# Patient Record
Sex: Female | Born: 1955 | Hispanic: No | Marital: Married | Smoking: Never smoker
Health system: Southern US, Community
[De-identification: ages and names within clinical notes are randomized; demographics above are authoritative.]

## PROBLEM LIST (undated history)

## (undated) DIAGNOSIS — E7800 Pure hypercholesterolemia, unspecified: Secondary | ICD-10-CM

## (undated) DIAGNOSIS — I10 Essential (primary) hypertension: Secondary | ICD-10-CM

## (undated) DIAGNOSIS — E079 Disorder of thyroid, unspecified: Secondary | ICD-10-CM

## (undated) DIAGNOSIS — K219 Gastro-esophageal reflux disease without esophagitis: Secondary | ICD-10-CM

## (undated) HISTORY — PX: CHOLECYSTECTOMY: SHX55

## (undated) HISTORY — DX: Disorder of thyroid, unspecified: E07.9

## (undated) HISTORY — DX: Gastro-esophageal reflux disease without esophagitis: K21.9

## (undated) HISTORY — PX: THYROIDECTOMY: SHX17

---

## 2001-09-08 ENCOUNTER — Encounter: Payer: Self-pay | Admitting: Family Medicine

## 2001-09-08 ENCOUNTER — Ambulatory Visit (HOSPITAL_COMMUNITY): Admission: RE | Admit: 2001-09-08 | Discharge: 2001-09-08 | Payer: Self-pay | Admitting: Family Medicine

## 2003-07-29 ENCOUNTER — Emergency Department (HOSPITAL_COMMUNITY): Admission: EM | Admit: 2003-07-29 | Discharge: 2003-07-29 | Payer: Self-pay | Admitting: Emergency Medicine

## 2004-01-02 ENCOUNTER — Emergency Department (HOSPITAL_COMMUNITY): Admission: EM | Admit: 2004-01-02 | Discharge: 2004-01-02 | Payer: Self-pay | Admitting: Emergency Medicine

## 2005-06-22 ENCOUNTER — Encounter: Admission: RE | Admit: 2005-06-22 | Discharge: 2005-07-17 | Payer: Self-pay | Admitting: Family Medicine

## 2005-07-31 ENCOUNTER — Ambulatory Visit: Payer: Self-pay | Admitting: Family Medicine

## 2005-09-01 ENCOUNTER — Ambulatory Visit: Payer: Self-pay | Admitting: Family Medicine

## 2005-09-10 ENCOUNTER — Ambulatory Visit (HOSPITAL_COMMUNITY): Admission: RE | Admit: 2005-09-10 | Discharge: 2005-09-10 | Payer: Self-pay | Admitting: Family Medicine

## 2005-09-29 ENCOUNTER — Ambulatory Visit: Payer: Self-pay | Admitting: Family Medicine

## 2005-09-29 ENCOUNTER — Encounter: Admission: RE | Admit: 2005-09-29 | Discharge: 2005-09-29 | Payer: Self-pay | Admitting: Family Medicine

## 2005-10-06 ENCOUNTER — Ambulatory Visit (HOSPITAL_COMMUNITY): Admission: RE | Admit: 2005-10-06 | Discharge: 2005-10-06 | Payer: Self-pay | Admitting: Family Medicine

## 2005-11-05 ENCOUNTER — Ambulatory Visit: Payer: Self-pay | Admitting: *Deleted

## 2005-11-05 ENCOUNTER — Ambulatory Visit: Payer: Self-pay | Admitting: Family Medicine

## 2006-01-13 ENCOUNTER — Ambulatory Visit: Payer: Self-pay | Admitting: Family Medicine

## 2006-05-06 ENCOUNTER — Ambulatory Visit: Payer: Self-pay | Admitting: Family Medicine

## 2006-06-08 ENCOUNTER — Ambulatory Visit: Payer: Self-pay | Admitting: Family Medicine

## 2006-06-22 ENCOUNTER — Ambulatory Visit: Payer: Self-pay | Admitting: Family Medicine

## 2006-08-19 ENCOUNTER — Encounter: Admission: RE | Admit: 2006-08-19 | Discharge: 2006-08-19 | Payer: Self-pay | Admitting: Family Medicine

## 2006-09-23 ENCOUNTER — Encounter: Admission: RE | Admit: 2006-09-23 | Discharge: 2006-09-23 | Payer: Self-pay | Admitting: Family Medicine

## 2006-11-17 ENCOUNTER — Ambulatory Visit: Payer: Self-pay | Admitting: Family Medicine

## 2007-03-02 ENCOUNTER — Ambulatory Visit: Payer: Self-pay | Admitting: Family Medicine

## 2007-03-02 LAB — CONVERTED CEMR LAB: TSH: 2.098 microintl units/mL (ref 0.350–5.50)

## 2007-03-08 ENCOUNTER — Emergency Department (HOSPITAL_COMMUNITY): Admission: EM | Admit: 2007-03-08 | Discharge: 2007-03-09 | Payer: Self-pay | Admitting: Emergency Medicine

## 2007-04-06 ENCOUNTER — Encounter (INDEPENDENT_AMBULATORY_CARE_PROVIDER_SITE_OTHER): Payer: Self-pay | Admitting: *Deleted

## 2007-05-23 ENCOUNTER — Ambulatory Visit: Payer: Self-pay | Admitting: Family Medicine

## 2007-05-26 ENCOUNTER — Ambulatory Visit (HOSPITAL_COMMUNITY): Admission: RE | Admit: 2007-05-26 | Discharge: 2007-05-26 | Payer: Self-pay | Admitting: Family Medicine

## 2007-10-03 ENCOUNTER — Ambulatory Visit: Payer: Self-pay | Admitting: Family Medicine

## 2007-10-03 ENCOUNTER — Ambulatory Visit (HOSPITAL_COMMUNITY): Admission: RE | Admit: 2007-10-03 | Discharge: 2007-10-03 | Payer: Self-pay | Admitting: Family Medicine

## 2007-12-27 ENCOUNTER — Encounter (INDEPENDENT_AMBULATORY_CARE_PROVIDER_SITE_OTHER): Payer: Self-pay | Admitting: Family Medicine

## 2007-12-27 ENCOUNTER — Ambulatory Visit: Payer: Self-pay | Admitting: Internal Medicine

## 2007-12-27 LAB — CONVERTED CEMR LAB
ALT: 16 units/L (ref 0–35)
AST: 18 units/L (ref 0–37)
Albumin: 4.6 g/dL (ref 3.5–5.2)
Alkaline Phosphatase: 71 units/L (ref 39–117)
BUN: 14 mg/dL (ref 6–23)
CO2: 23 meq/L (ref 19–32)
Chloride: 101 meq/L (ref 96–112)
Creatinine, Ser: 0.56 mg/dL (ref 0.40–1.20)
Total Bilirubin: 0.4 mg/dL (ref 0.3–1.2)
Total CHOL/HDL Ratio: 4.4
Triglycerides: 169 mg/dL — ABNORMAL HIGH (ref <150)
VLDL: 34 mg/dL (ref 0–40)

## 2008-05-04 ENCOUNTER — Ambulatory Visit: Payer: Self-pay | Admitting: Internal Medicine

## 2008-06-25 ENCOUNTER — Ambulatory Visit: Payer: Self-pay | Admitting: Internal Medicine

## 2008-06-25 ENCOUNTER — Encounter (INDEPENDENT_AMBULATORY_CARE_PROVIDER_SITE_OTHER): Payer: Self-pay | Admitting: Family Medicine

## 2008-06-25 LAB — CONVERTED CEMR LAB
BUN: 16 mg/dL
CO2: 24 meq/L
Calcium: 9.7 mg/dL
Chloride: 101 meq/L
Creatinine, Ser: 0.56 mg/dL
Free T4: 1.1 ng/dL
Glucose, Bld: 104 mg/dL — ABNORMAL HIGH
Potassium: 4.1 meq/L
Sodium: 140 meq/L
TSH: 1.893 u[IU]/mL
Vit D, 1,25-Dihydroxy: 24 — ABNORMAL LOW

## 2008-08-27 ENCOUNTER — Encounter: Admission: RE | Admit: 2008-08-27 | Discharge: 2008-08-27 | Payer: Self-pay | Admitting: Cardiovascular Disease

## 2009-01-31 ENCOUNTER — Ambulatory Visit: Payer: Self-pay | Admitting: Family Medicine

## 2009-02-11 ENCOUNTER — Encounter: Admission: RE | Admit: 2009-02-11 | Discharge: 2009-02-11 | Payer: Self-pay | Admitting: Cardiovascular Disease

## 2009-12-11 ENCOUNTER — Encounter: Admission: RE | Admit: 2009-12-11 | Discharge: 2009-12-11 | Payer: Self-pay | Admitting: Internal Medicine

## 2010-10-24 ENCOUNTER — Other Ambulatory Visit: Payer: Self-pay | Admitting: Internal Medicine

## 2010-10-24 DIAGNOSIS — Z1231 Encounter for screening mammogram for malignant neoplasm of breast: Secondary | ICD-10-CM

## 2010-12-16 ENCOUNTER — Ambulatory Visit: Payer: Self-pay

## 2010-12-16 ENCOUNTER — Ambulatory Visit
Admission: RE | Admit: 2010-12-16 | Discharge: 2010-12-16 | Disposition: A | Discharge status: Home / Self Care | Payer: Medicaid Other | Source: Ambulatory Visit | Attending: Internal Medicine | Admitting: Internal Medicine

## 2010-12-16 DIAGNOSIS — Z1231 Encounter for screening mammogram for malignant neoplasm of breast: Secondary | ICD-10-CM

## 2011-06-14 ENCOUNTER — Emergency Department (HOSPITAL_COMMUNITY)
Admission: EM | Admit: 2011-06-14 | Discharge: 2011-06-15 | Disposition: A | Discharge status: Home / Self Care | Payer: Medicaid Other | Attending: Emergency Medicine | Admitting: Emergency Medicine

## 2011-06-14 ENCOUNTER — Encounter: Payer: Self-pay | Admitting: Emergency Medicine

## 2011-06-14 DIAGNOSIS — I10 Essential (primary) hypertension: Secondary | ICD-10-CM | POA: Insufficient documentation

## 2011-06-14 DIAGNOSIS — M545 Low back pain, unspecified: Secondary | ICD-10-CM | POA: Insufficient documentation

## 2011-06-14 DIAGNOSIS — M5430 Sciatica, unspecified side: Secondary | ICD-10-CM | POA: Insufficient documentation

## 2011-06-14 DIAGNOSIS — M546 Pain in thoracic spine: Secondary | ICD-10-CM | POA: Insufficient documentation

## 2011-06-14 DIAGNOSIS — M79609 Pain in unspecified limb: Secondary | ICD-10-CM | POA: Insufficient documentation

## 2011-06-14 DIAGNOSIS — Z79899 Other long term (current) drug therapy: Secondary | ICD-10-CM | POA: Insufficient documentation

## 2011-06-14 DIAGNOSIS — E119 Type 2 diabetes mellitus without complications: Secondary | ICD-10-CM | POA: Insufficient documentation

## 2011-06-14 HISTORY — DX: Essential (primary) hypertension: I10

## 2011-06-14 NOTE — ED Notes (Signed)
PT. REPORTS UPPER AND LOWER BAK PAIN WITH BILATERAL LEG PAIN X 6 DAYS DENIES INJURY OR FALL .

## 2011-06-15 MED ORDER — OXYCODONE-ACETAMINOPHEN 7.5-325 MG PO TABS: 1.0000 | ORAL_TABLET | ORAL | Status: AC | PRN

## 2011-06-15 MED ORDER — CYCLOBENZAPRINE HCL 10 MG PO TABS
10.0000 mg | ORAL_TABLET | Freq: Once | ORAL | Status: AC
  Administered 2011-06-15: 10 mg via ORAL
  Filled 2011-06-15: qty 1

## 2011-06-15 MED ORDER — IBUPROFEN 600 MG PO TABS: 600.0000 mg | ORAL_TABLET | Freq: Four times a day (QID) | ORAL | Status: AC | PRN

## 2011-06-15 MED ORDER — CYCLOBENZAPRINE HCL 10 MG PO TABS: 10.0000 mg | ORAL_TABLET | Freq: Two times a day (BID) | ORAL | Status: AC | PRN

## 2011-06-15 NOTE — ED Notes (Signed)
Pt requested flexeril prior to discharge.  Dr. Radford Pax notified.

## 2011-06-15 NOTE — ED Notes (Signed)
Pt states three days in a sitting position three days ago reorganizing in her home for about twenty minutes.  Stated that when she sat up she started having lower back and bilateral calf pain. Stated she took ibuprofen yesterday at 2200 with moderate relief.  Denies any falls or other injuries to her back and legs.  States that when she moves she has pain and is unable to sit up.  When she is resting the pain goes away.

## 2011-06-15 NOTE — ED Provider Notes (Signed)
History     CSN: [id number] Arrival date & time: 06/14/2011 10:50 PM   First MD Initiated Contact with Patient 06/15/11 [phone]      Chief Complaint  Patient presents with  . Back Pain    (Consider location/radiation/quality/duration/timing/severity/associated sxs/prior treatment) HPI  Pt states three days in a sitting position three days ago reorganizing in her home for about twenty minutes. Stated that when she sat up she started having lower back and bilateral calf pain. Stated she took ibuprofen yesterday at 2200 with moderate relief. Denies any falls or other injuries to her back and legs. States that when she moves she has pain and is unable to sit up. When she is resting the pain goes away.  Patient denies fever, chills, loss of bowel or bladder function.  Patient denies numbness.   Past Medical History  Diagnosis Date  . Hypertension   . Diabetes mellitus     Past Surgical History  Procedure Date  . Cholecystectomy   . Thyroidectomy     No family history on file.  History  Substance Use Topics  . Smoking status: Never Smoker   . Smokeless tobacco: Not on file  . Alcohol Use: No    OB History    Grav Para Term Preterm Abortions TAB SAB Ect Mult Living                  Review of Systems Review of systems all negative except as noted in history of present illness Allergies  Review of patient's allergies indicates no known allergies.  Home Medications   Current Outpatient Rx  Name Route Sig Dispense Refill  . AMLODIPINE BESYLATE 5 MG PO TABS Oral Take 5 mg by mouth daily.      Marland Kitchen BISACODYL 5 MG PO TBEC Oral Take 5 mg by mouth 2 (two) times daily as needed. For constipation     . COLESEVELAM HCL 625 MG PO TABS Oral Take 1,250 mg by mouth 3 (three) times daily.      Marland Kitchen FLAX SEED OIL PO Oral Take 1,000 mg by mouth daily.      Marland Kitchen GLUCOSAMINE CHONDROITIN COMPLX PO CAPS Oral Take 1 capsule by mouth daily.      . IBUPROFEN 800 MG PO TABS Oral Take 800 mg by mouth  every 8 (eight) hours as needed. For pain     . LEVOTHYROXINE SODIUM 150 MCG PO TABS Oral Take 150 mcg by mouth daily.      Marland Kitchen METFORMIN HCL 500 MG PO TABS Oral Take 500 mg by mouth daily with breakfast.      . OMEPRAZOLE 20 MG PO CPDR Oral Take 20 mg by mouth daily.      . TRAMADOL HCL 50 MG PO TABS Oral Take 50 mg by mouth 2 (two) times daily. Maximum dose= 8 tablets per day       BP 140/69  Pulse 90  Temp(Src) 98 F (36.7 C) (Oral)  Resp 14  SpO2 98%  Physical Exam  Nursing note and vitals reviewed. Constitutional: She is oriented to person, place, and time. She appears well-developed and well-nourished. No distress.  HENT:  Head: Normocephalic and atraumatic.  Eyes: Pupils are equal, round, and reactive to light.  Neck: Normal range of motion.  Cardiovascular: Normal rate and intact distal pulses.   Pulmonary/Chest: No respiratory distress.  Abdominal: Normal appearance. She exhibits no distension.  Musculoskeletal: Normal range of motion.       Thoracic back: She exhibits tenderness  and pain.       Lumbar back: She exhibits tenderness and pain.       Back:       Pain radiates down back to both legs  Neurological: She is alert and oriented to person, place, and time. No cranial nerve deficit.  Skin: Skin is warm and dry. No rash noted.  Psychiatric: She has a normal mood and affect. Her behavior is normal.    ED Course  Procedures (including critical care time)  Labs Reviewed - No data to display No results found.   1. Sciatica       MDM          Nelia Shi, MD 06/15/11 [phone]

## 2011-07-21 HISTORY — PX: COLONOSCOPY: SHX174

## 2011-11-20 ENCOUNTER — Other Ambulatory Visit: Payer: Self-pay | Admitting: Internal Medicine

## 2011-11-20 DIAGNOSIS — Z1231 Encounter for screening mammogram for malignant neoplasm of breast: Secondary | ICD-10-CM

## 2011-12-09 ENCOUNTER — Ambulatory Visit (HOSPITAL_COMMUNITY)
Admission: RE | Admit: 2011-12-09 | Discharge: 2011-12-09 | Disposition: A | Discharge status: Home / Self Care | Payer: Medicaid Other | Source: Ambulatory Visit | Attending: Chiropractic Medicine | Admitting: Chiropractic Medicine

## 2011-12-09 ENCOUNTER — Other Ambulatory Visit (HOSPITAL_COMMUNITY): Payer: Self-pay | Admitting: Chiropractic Medicine

## 2011-12-09 DIAGNOSIS — M5030 Other cervical disc degeneration, unspecified cervical region: Secondary | ICD-10-CM | POA: Insufficient documentation

## 2011-12-09 DIAGNOSIS — R52 Pain, unspecified: Secondary | ICD-10-CM

## 2011-12-09 DIAGNOSIS — M79609 Pain in unspecified limb: Secondary | ICD-10-CM | POA: Insufficient documentation

## 2011-12-09 DIAGNOSIS — M25519 Pain in unspecified shoulder: Secondary | ICD-10-CM | POA: Insufficient documentation

## 2011-12-09 DIAGNOSIS — M47812 Spondylosis without myelopathy or radiculopathy, cervical region: Secondary | ICD-10-CM | POA: Insufficient documentation

## 2011-12-09 DIAGNOSIS — M545 Low back pain, unspecified: Secondary | ICD-10-CM | POA: Insufficient documentation

## 2011-12-09 DIAGNOSIS — M546 Pain in thoracic spine: Secondary | ICD-10-CM | POA: Insufficient documentation

## 2011-12-17 ENCOUNTER — Ambulatory Visit
Admission: RE | Admit: 2011-12-17 | Discharge: 2011-12-17 | Disposition: A | Discharge status: Home / Self Care | Payer: Medicaid Other | Source: Ambulatory Visit | Attending: Internal Medicine | Admitting: Internal Medicine

## 2011-12-17 DIAGNOSIS — Z1231 Encounter for screening mammogram for malignant neoplasm of breast: Secondary | ICD-10-CM

## 2011-12-29 ENCOUNTER — Ambulatory Visit: Payer: Medicaid Other | Attending: Family Medicine

## 2011-12-29 DIAGNOSIS — R5381 Other malaise: Secondary | ICD-10-CM | POA: Insufficient documentation

## 2011-12-29 DIAGNOSIS — M2550 Pain in unspecified joint: Secondary | ICD-10-CM | POA: Insufficient documentation

## 2011-12-29 DIAGNOSIS — IMO0001 Reserved for inherently not codable concepts without codable children: Secondary | ICD-10-CM | POA: Insufficient documentation

## 2011-12-29 DIAGNOSIS — R293 Abnormal posture: Secondary | ICD-10-CM | POA: Insufficient documentation

## 2012-01-06 ENCOUNTER — Ambulatory Visit: Payer: Medicaid Other | Admitting: Physical Therapy

## 2012-01-12 ENCOUNTER — Ambulatory Visit: Payer: Medicaid Other

## 2012-01-20 ENCOUNTER — Ambulatory Visit: Payer: Medicaid Other | Attending: Family Medicine

## 2012-01-20 DIAGNOSIS — M2550 Pain in unspecified joint: Secondary | ICD-10-CM | POA: Insufficient documentation

## 2012-01-20 DIAGNOSIS — IMO0001 Reserved for inherently not codable concepts without codable children: Secondary | ICD-10-CM | POA: Insufficient documentation

## 2012-01-20 DIAGNOSIS — R293 Abnormal posture: Secondary | ICD-10-CM | POA: Insufficient documentation

## 2012-01-20 DIAGNOSIS — R5381 Other malaise: Secondary | ICD-10-CM | POA: Insufficient documentation

## 2012-10-12 ENCOUNTER — Other Ambulatory Visit: Payer: Self-pay

## 2012-10-12 DIAGNOSIS — Z1231 Encounter for screening mammogram for malignant neoplasm of breast: Secondary | ICD-10-CM

## 2012-12-22 ENCOUNTER — Ambulatory Visit
Admission: RE | Admit: 2012-12-22 | Discharge: 2012-12-22 | Disposition: A | Discharge status: Home / Self Care | Payer: Medicaid Other | Source: Ambulatory Visit

## 2012-12-22 DIAGNOSIS — Z1231 Encounter for screening mammogram for malignant neoplasm of breast: Secondary | ICD-10-CM

## 2013-06-07 ENCOUNTER — Ambulatory Visit: Payer: Self-pay | Admitting: Obstetrics

## 2013-06-12 ENCOUNTER — Encounter: Payer: Self-pay | Admitting: Internal Medicine

## 2013-06-12 ENCOUNTER — Ambulatory Visit: Payer: No Typology Code available for payment source | Attending: Internal Medicine | Admitting: Internal Medicine

## 2013-06-12 VITALS — BP 187/100 | HR 86 | Temp 97.8°F | Resp 17 | Ht 62.0 in | Wt 233.0 lb

## 2013-06-12 DIAGNOSIS — Z139 Encounter for screening, unspecified: Secondary | ICD-10-CM

## 2013-06-12 DIAGNOSIS — E119 Type 2 diabetes mellitus without complications: Secondary | ICD-10-CM | POA: Insufficient documentation

## 2013-06-12 DIAGNOSIS — Z23 Encounter for immunization: Secondary | ICD-10-CM

## 2013-06-12 DIAGNOSIS — E079 Disorder of thyroid, unspecified: Secondary | ICD-10-CM | POA: Insufficient documentation

## 2013-06-12 DIAGNOSIS — E785 Hyperlipidemia, unspecified: Secondary | ICD-10-CM

## 2013-06-12 DIAGNOSIS — I10 Essential (primary) hypertension: Secondary | ICD-10-CM | POA: Insufficient documentation

## 2013-06-12 DIAGNOSIS — K089 Disorder of teeth and supporting structures, unspecified: Secondary | ICD-10-CM

## 2013-06-12 DIAGNOSIS — M79609 Pain in unspecified limb: Secondary | ICD-10-CM | POA: Insufficient documentation

## 2013-06-12 DIAGNOSIS — K0889 Other specified disorders of teeth and supporting structures: Secondary | ICD-10-CM

## 2013-06-12 DIAGNOSIS — K219 Gastro-esophageal reflux disease without esophagitis: Secondary | ICD-10-CM | POA: Insufficient documentation

## 2013-06-12 LAB — LIPID PANEL: Cholesterol: 188 mg/dL (ref 0–200)

## 2013-06-12 LAB — CBC WITH DIFFERENTIAL/PLATELET
Basophils Absolute: 0 10*3/uL (ref 0.0–0.1)
Eosinophils Relative: 2 % (ref 0–5)
HCT: 40.2 % (ref 36.0–46.0)
Lymphocytes Relative: 37 % (ref 12–46)
Lymphs Abs: 2.5 10*3/uL (ref 0.7–4.0)
MCV: 72.2 fL — ABNORMAL LOW (ref 78.0–100.0)
Monocytes Absolute: 0.3 10*3/uL (ref 0.1–1.0)
Monocytes Relative: 5 % (ref 3–12)
RDW: 15.1 % (ref 11.5–15.5)
WBC: 6.8 10*3/uL (ref 4.0–10.5)

## 2013-06-12 LAB — COMPLETE METABOLIC PANEL WITH GFR
BUN: 8 mg/dL (ref 6–23)
CO2: 28 mEq/L (ref 19–32)
Calcium: 9.9 mg/dL (ref 8.4–10.5)
Chloride: 101 mEq/L (ref 96–112)
Creat: 0.57 mg/dL (ref 0.50–1.10)
GFR, Est African American: >89 mL/min
GFR, Est Non African American: >89 mL/min
Glucose, Bld: 117 mg/dL — ABNORMAL HIGH (ref 70–99)

## 2013-06-12 MED ORDER — GABAPENTIN 100 MG PO CAPS: 100.0000 mg | ORAL_CAPSULE | Freq: Three times a day (TID) | ORAL | Status: DC

## 2013-06-12 MED ORDER — ASPIRIN 81 MG PO TABS: 81.0000 mg | ORAL_TABLET | Freq: Every day | ORAL | Status: AC

## 2013-06-12 MED ORDER — SIMVASTATIN 20 MG PO TABS: 20.0000 mg | ORAL_TABLET | Freq: Every day | ORAL | Status: DC

## 2013-06-12 MED ORDER — LOSARTAN POTASSIUM 25 MG PO TABS: 25.0000 mg | ORAL_TABLET | Freq: Every day | ORAL | Status: DC

## 2013-06-12 MED ORDER — PANTOPRAZOLE SODIUM 40 MG PO TBEC: 40.0000 mg | DELAYED_RELEASE_TABLET | Freq: Every day | ORAL | Status: DC

## 2013-06-12 MED ORDER — AMLODIPINE BESYLATE 5 MG PO TABS: 5.0000 mg | ORAL_TABLET | Freq: Every day | ORAL | Status: DC

## 2013-06-12 MED ORDER — MELOXICAM 7.5 MG PO TABS: 7.5000 mg | ORAL_TABLET | Freq: Every day | ORAL | Status: DC

## 2013-06-12 MED ORDER — LEVOTHYROXINE SODIUM 150 MCG PO TABS: 150.0000 ug | ORAL_TABLET | Freq: Every day | ORAL | Status: DC

## 2013-06-12 MED ORDER — METFORMIN HCL 500 MG PO TABS: 500.0000 mg | ORAL_TABLET | Freq: Every day | ORAL | Status: DC

## 2013-06-12 NOTE — Patient Instructions (Signed)

## 2013-06-12 NOTE — Progress Notes (Signed)
Patient ID: Katie Rhodes, female   DOB: 1956/03/06, 57 y.o.   MRN: [id number] MRN: [id number] Name: Katie Rhodes  Sex: female Age: 57 y.o. DOB: 01-Feb-1956  Allergies: Review of patient's allergies indicates no known allergies.  Chief Complaint  Patient presents with   Diabetes   Thyroid Problem   Hypertension    HPI: Patient is 57 y.o. female who comes today to establish medical care, she is history of diabetes hypertension hyperlipidemia, thyroid disease and is on thyroid medication, her blood pressure is elevated, as per patient she feels anxious when she is in doctor's office on denies any headache dizziness chest pain shortness of breath, patient is requesting refill on the medications. She also reported to have some dental pain and is requesting to see a dentist denies any fever chills any sore throat.  Past Medical History  Diagnosis Date   Hypertension    Diabetes mellitus    Thyroid disease     Past Surgical History  Procedure Laterality Date   Cholecystectomy     Thyroidectomy        Medication List       This list is accurate as of: 06/12/13  3:33 PM.  Always use your most recent med list.               amLODipine 5 MG tablet  Commonly known as:  NORVASC  Take 1 tablet (5 mg total) by mouth daily.     aspirin 81 MG tablet  Take 1 tablet (81 mg total) by mouth daily.     bisacodyl 5 MG EC tablet  Commonly known as:  DULCOLAX  Take 5 mg by mouth 2 (two) times daily as needed. For constipation     CALCIUM 500+D PO  Take by mouth.     cholestyramine light 4 G packet  Commonly known as:  PREVALITE  Take 4 g by mouth 2 (two) times daily.     colesevelam 625 MG tablet  Commonly known as:  WELCHOL  Take 1,250 mg by mouth 3 (three) times daily.     FLAX SEED OIL PO  Take 1,000 mg by mouth daily.     gabapentin 100 MG capsule  Commonly known as:  NEURONTIN  Take 1 capsule (100 mg total) by mouth 3 (three) times daily.     GLUCOSAMINE CHONDROITIN  COMPLX Caps  Take 1 capsule by mouth daily.     levothyroxine 150 MCG tablet  Commonly known as:  SYNTHROID, LEVOTHROID  Take 1 tablet (150 mcg total) by mouth daily.     losartan 25 MG tablet  Commonly known as:  COZAAR  Take 1 tablet (25 mg total) by mouth daily.     meloxicam 7.5 MG tablet  Commonly known as:  MOBIC  Take 1 tablet (7.5 mg total) by mouth daily.     metFORMIN 500 MG tablet  Commonly known as:  GLUCOPHAGE  Take 1 tablet (500 mg total) by mouth daily with breakfast.     methocarbamol 500 MG tablet  Commonly known as:  ROBAXIN  Take 500 mg by mouth 2 (two) times daily.     omeprazole 20 MG capsule  Commonly known as:  PRILOSEC  Take 20 mg by mouth daily.     pantoprazole 40 MG tablet  Commonly known as:  PROTONIX  Take 1 tablet (40 mg total) by mouth daily.     simvastatin 20 MG tablet  Commonly known as:  ZOCOR  Take 1 tablet (  20 mg total) by mouth daily.     VITAMIN D-3 PO  Take by mouth.        Meds ordered this encounter  Medications   DISCONTD: aspirin 81 MG tablet    Sig: Take 81 mg by mouth daily.   Cholecalciferol (VITAMIN D-3 PO)    Sig: Take by mouth.   Calcium Carbonate-Vitamin D (CALCIUM 500+D PO)    Sig: Take by mouth.   DISCONTD: pantoprazole (PROTONIX) 40 MG tablet    Sig: Take 40 mg by mouth daily.   DISCONTD: gabapentin (NEURONTIN) 100 MG capsule    Sig: Take 100 mg by mouth 3 (three) times daily.   DISCONTD: losartan (COZAAR) 25 MG tablet    Sig: Take 25 mg by mouth daily.   methocarbamol (ROBAXIN) 500 MG tablet    Sig: Take 500 mg by mouth 2 (two) times daily.   DISCONTD: meloxicam (MOBIC) 7.5 MG tablet    Sig: Take 7.5 mg by mouth daily.   DISCONTD: simvastatin (ZOCOR) 20 MG tablet    Sig: Take 20 mg by mouth daily.   cholestyramine light (PREVALITE) 4 G packet    Sig: Take 4 g by mouth 2 (two) times daily.   simvastatin (ZOCOR) 20 MG tablet    Sig: Take 1 tablet (20 mg total) by mouth daily.    Dispense:   30 tablet    Refill:  3   pantoprazole (PROTONIX) 40 MG tablet    Sig: Take 1 tablet (40 mg total) by mouth daily.    Dispense:  30 tablet    Refill:  3   metFORMIN (GLUCOPHAGE) 500 MG tablet    Sig: Take 1 tablet (500 mg total) by mouth daily with breakfast.    Dispense:  90 tablet    Refill:  1   meloxicam (MOBIC) 7.5 MG tablet    Sig: Take 1 tablet (7.5 mg total) by mouth daily.    Dispense:  30 tablet    Refill:  1   losartan (COZAAR) 25 MG tablet    Sig: Take 1 tablet (25 mg total) by mouth daily.    Dispense:  30 tablet    Refill:  3   levothyroxine (SYNTHROID, LEVOTHROID) 150 MCG tablet    Sig: Take 1 tablet (150 mcg total) by mouth daily.    Dispense:  30 tablet    Refill:  3   gabapentin (NEURONTIN) 100 MG capsule    Sig: Take 1 capsule (100 mg total) by mouth 3 (three) times daily.    Dispense:  90 capsule    Refill:  1   amLODipine (NORVASC) 5 MG tablet    Sig: Take 1 tablet (5 mg total) by mouth daily.    Dispense:  30 tablet    Refill:  3   aspirin 81 MG tablet    Sig: Take 1 tablet (81 mg total) by mouth daily.    Dispense:  30 tablet    Refill:  3    Immunization History  Administered Date(s) Administered   Influenza,inj,Quad PF,36+ Mos 06/12/2013    History  Substance Use Topics   Smoking status: Never Smoker    Smokeless tobacco: Not on file   Alcohol Use: No    Review of Systems  As noted in HPI  Filed Vitals:   06/12/13 1443  BP: 187/100  Pulse: 86  Temp: 97.8 F (36.6 C)  Resp: 17    Physical Exam  Physical Exam  Constitutional: No distress.  HENT:  Dental caries   Eyes: EOM are normal. Pupils are equal, round, and reactive to light.  Cardiovascular: Normal rate and regular rhythm.   Pulmonary/Chest: Breath sounds normal. No respiratory distress. She has no wheezes. She has no rales.    CBC No results found for this basename: wbc, rbc, hgb, hct, plt, mcv, neutrabs, lymphsabs, monoabs, eosabs, basosabs     CMP     Component Value Date/Time   NA 140 06/25/2008 2010   K 4.1 06/25/2008 2010   CL 101 06/25/2008 2010   CO2 24 06/25/2008 2010   GLUCOSE 104* 06/25/2008 2010   BUN 16 06/25/2008 2010   CREATININE 0.56 06/25/2008 2010   CALCIUM 9.7 06/25/2008 2010   PROT 7.8 12/27/2007 2123   ALBUMIN 4.6 12/27/2007 2123   AST 18 12/27/2007 2123   ALT 16 12/27/2007 2123   ALKPHOS 71 12/27/2007 2123   BILITOT 0.4 12/27/2007 2123    Lab Results  Component Value Date/Time   CHOL 192 12/27/2007  9:23 PM    No components found with this basename: hga1c    Lab Results  Component Value Date/Time   AST 18 12/27/2007  9:23 PM    Assessment and Plan  Diabetes - Plan: metFORMIN (GLUCOPHAGE) 500 MG tablet, aspirin 81 MG tablet, COMPLETE METABOLIC PANEL WITH GFR, Lipid panel, Vit D  25 hydroxy (rtn osteoporosis monitoring)   HgB A1c 5.9%  Essential hypertension, benign - Plan: losartan (COZAAR) 25 MG tablet, amLODipine (NORVASC) 5 MG tablet   Advised for low Rhodes  diet and exercise  Thyroid disease - Plan: levothyroxine (SYNTHROID, LEVOTHROID) 150 MCG tablet, TSH  Hyperlipidemia - Plan: simvastatin (ZOCOR) 20 MG tablet  Pain in limb - Plan: meloxicam (MOBIC) 7.5 MG tablet, gabapentin (NEURONTIN) 100 MG capsule  Flu vaccine given today   GERD (gastroesophageal reflux disease) - Plan: pantoprazole (PROTONIX) 40 MG tablet  Screening - Plan: CBC with Differential  Pain, dental - Plan: Ambulatory referral to Dentistry    Return in about 4 weeks (around 07/10/2013).  Doris Cheadle, MD

## 2013-06-12 NOTE — Progress Notes (Signed)
Patient here to establish care Has history of DM HTN Thyroid problem  blood sugar today 109

## 2013-06-13 LAB — TSH: TSH: 1.318 u[IU]/mL (ref 0.350–4.500)

## 2013-06-13 LAB — VITAMIN D 25 HYDROXY (VIT D DEFICIENCY, FRACTURES): Vit D, 25-Hydroxy: 50 ng/mL (ref 30–89)

## 2013-07-18 ENCOUNTER — Other Ambulatory Visit: Payer: Self-pay | Admitting: Emergency Medicine

## 2013-07-18 ENCOUNTER — Ambulatory Visit: Payer: No Typology Code available for payment source | Attending: Internal Medicine | Admitting: Internal Medicine

## 2013-07-18 ENCOUNTER — Encounter: Payer: Self-pay | Admitting: Internal Medicine

## 2013-07-18 VITALS — BP 183/111 | HR 76 | Temp 98.9°F | Resp 15 | Ht 65.0 in | Wt 230.0 lb

## 2013-07-18 DIAGNOSIS — I10 Essential (primary) hypertension: Secondary | ICD-10-CM

## 2013-07-18 LAB — TSH: TSH: 0.064 u[IU]/mL — ABNORMAL LOW (ref 0.350–4.500)

## 2013-07-18 LAB — T4, FREE: Free T4: 1.36 ng/dL (ref 0.80–1.80)

## 2013-07-18 MED ORDER — GLUCOSE BLOOD VI STRP: ORAL_STRIP | Status: DC

## 2013-07-18 MED ORDER — BLOOD GLUCOSE METER KIT: PACK | Status: AC

## 2013-07-18 MED ORDER — AMLODIPINE BESYLATE 10 MG PO TABS: 10.0000 mg | ORAL_TABLET | Freq: Every day | ORAL | Status: DC

## 2013-07-18 MED ORDER — TRUEPLUS LANCETS 28G MISC: Status: DC

## 2013-07-18 NOTE — Progress Notes (Signed)
Patient ID: Katie Rhodes, female   DOB: 15-Aug-1955, 57 y.o.   MRN: [id number]   CC:  HPI:  57 year old female here for followup of her blood pressure her diabetes. She states that she has bilateral cataracts which are contributing to her blurry vision. She denies any headaches. She states that she gets anxious during an appointment with a physician. She states that one hour prior to coming to the clinic her blood pressure was in the 130s She denies any chest pain or shortness of breath    No Known Allergies Past Medical History  Diagnosis Date  . Hypertension   . Diabetes mellitus   . Thyroid disease    Current Outpatient Prescriptions on File Prior to Visit  Medication Sig Dispense Refill  . aspirin 81 MG tablet Take 1 tablet (81 mg total) by mouth daily.  30 tablet  3  . bisacodyl (DULCOLAX) 5 MG EC tablet Take 5 mg by mouth 2 (two) times daily as needed. For constipation       . Calcium Carbonate-Vitamin D (CALCIUM 500+D PO) Take by mouth.      . Cholecalciferol (VITAMIN D-3 PO) Take by mouth.      . cholestyramine light (PREVALITE) 4 G packet Take 4 g by mouth 2 (two) times daily.      . colesevelam (WELCHOL) 625 MG tablet Take 1,250 mg by mouth 3 (three) times daily.        . Flaxseed, Linseed, (FLAX SEED OIL PO) Take 1,000 mg by mouth daily.        Marland Kitchen gabapentin (NEURONTIN) 100 MG capsule Take 1 capsule (100 mg total) by mouth 3 (three) times daily.  90 capsule  1  . Glucosamine-Chondroit-Vit C-Mn (GLUCOSAMINE CHONDROITIN COMPLX) CAPS Take 1 capsule by mouth daily.        Marland Kitchen levothyroxine (SYNTHROID, LEVOTHROID) 150 MCG tablet Take 1 tablet (150 mcg total) by mouth daily.  30 tablet  3  . losartan (COZAAR) 25 MG tablet Take 1 tablet (25 mg total) by mouth daily.  30 tablet  3  . meloxicam (MOBIC) 7.5 MG tablet Take 1 tablet (7.5 mg total) by mouth daily.  30 tablet  1  . metFORMIN (GLUCOPHAGE) 500 MG tablet Take 1 tablet (500 mg total) by mouth daily with breakfast.  90 tablet  1  .  methocarbamol (ROBAXIN) 500 MG tablet Take 500 mg by mouth 2 (two) times daily.      Marland Kitchen omeprazole (PRILOSEC) 20 MG capsule Take 20 mg by mouth daily.        . pantoprazole (PROTONIX) 40 MG tablet Take 1 tablet (40 mg total) by mouth daily.  30 tablet  3  . simvastatin (ZOCOR) 20 MG tablet Take 1 tablet (20 mg total) by mouth daily.  30 tablet  3   No current facility-administered medications on file prior to visit.   Family History  Problem Relation Age of Onset  . Thyroid disease Mother    History   Social History  . Marital Status: Married    Spouse Name: N/A    Number of Children: N/A  . Years of Education: N/A   Occupational History  . Not on file.   Social History Main Topics  . Smoking status: Never Smoker   . Smokeless tobacco: Not on file  . Alcohol Use: No  . Drug Use: No  . Sexual Activity: Not on file   Other Topics Concern  . Not on file   Social History Narrative  .  No narrative on file    Review of Systems  Constitutional: Negative for fever, chills, diaphoresis, activity change, appetite change and fatigue.  HENT: Negative for ear pain, nosebleeds, congestion, facial swelling, rhinorrhea, neck pain, neck stiffness and ear discharge.   Eyes: Negative for pain, discharge, redness, itching and visual disturbance.  Respiratory: Negative for cough, choking, chest tightness, shortness of breath, wheezing and stridor.   Cardiovascular: Negative for chest pain, palpitations and leg swelling.  Gastrointestinal: Negative for abdominal distention.  Genitourinary: Negative for dysuria, urgency, frequency, hematuria, flank pain, decreased urine volume, difficulty urinating and dyspareunia.  Musculoskeletal: Negative for back pain, joint swelling, arthralgias and gait problem.  Neurological: Negative for dizziness, tremors, seizures, syncope, facial asymmetry, speech difficulty, weakness, light-headedness, numbness and headaches.  Hematological: Negative for adenopathy.  Does not bruise/bleed easily.  Psychiatric/Behavioral: Negative for hallucinations, behavioral problems, confusion, dysphoric mood, decreased concentration and agitation.    Objective:   Filed Vitals:   07/18/13 1503  BP: 183/111  Pulse: 76  Temp: 98.9 F (37.2 C)  Resp: 15    Physical Exam  Constitutional: Appears well-developed and well-nourished. No distress.  HENT: Normocephalic. External right and left ear normal. Oropharynx is clear and moist.  Eyes: Conjunctivae and EOM are normal. PERRLA, no scleral icterus.  Neck: Normal ROM. Neck supple. No JVD. No tracheal deviation. No thyromegaly.  CVS: RRR, S1/S2 +, no murmurs, no gallops, no carotid bruit.  Pulmonary: Effort and breath sounds normal, no stridor, rhonchi, wheezes, rales.  Abdominal: Soft. BS +,  no distension, tenderness, rebound or guarding.  Musculoskeletal: Normal range of motion. No edema and no tenderness.  Lymphadenopathy: No lymphadenopathy noted, cervical, inguinal. Neuro: Alert. Normal reflexes, muscle tone coordination. No cranial nerve deficit. Skin: Skin is warm and dry. No rash noted. Not diaphoretic. No erythema. No pallor.  Psychiatric: Normal mood and affect. Behavior, judgment, thought content normal.   Lab Results  Component Value Date   WBC 6.8 06/12/2013   HGB 13.3 06/12/2013   HCT 40.2 06/12/2013   MCV 72.2* 06/12/2013   PLT 226 06/12/2013   Lab Results  Component Value Date   CREATININE 0.57 06/12/2013   BUN 8 06/12/2013   NA 137 06/12/2013   K 4.5 06/12/2013   CL 101 06/12/2013   CO2 28 06/12/2013    Lab Results  Component Value Date   HGBA1C 5.9 06/12/2013   Lipid Panel     Component Value Date/Time   CHOL 188 06/12/2013 1523   TRIG 237* 06/12/2013 1523   HDL 53 06/12/2013 1523   CHOLHDL 3.5 06/12/2013 1523   VLDL 47* 06/12/2013 1523   LDLCALC 88 06/12/2013 1523       Assessment and plan:   Patient Active Problem List   Diagnosis Date Noted  . Diabetes 06/12/2013   . Essential hypertension, benign 06/12/2013  . Thyroid disease 06/12/2013  . Hyperlipidemia 06/12/2013  . GERD (gastroesophageal reflux disease) 06/12/2013  . Pain in limb 06/12/2013       Hypertension  Increase Norvasc to 10 mg Nurse visit to recheck blood pressure in one week Compare ambulatory blood pressure monitor to the clinic monitor She states that she had a retinal scan done 6 months ago    Hypothyroidism Continue current dose of Synthroid  Followup in one week for blood pressure check   The patient was given clear instructions to go to ER or return to medical center if symptoms don't improve, worsen or new problems develop. The patient verbalized understanding. The  patient was told to call to get any lab results if not heard anything in the next week.

## 2013-07-18 NOTE — Progress Notes (Signed)
Pt is here for a f/u. BP is 183/111 today. No pain. Complains of fatigue on and off x2 weeks; blurred vision and water eyes. Pt has corrected vision.  Pt has daughter interpreting.

## 2013-07-18 NOTE — Addendum Note (Signed)
Addended by: Susie Cassette MD, Germain Osgood on: 07/18/2013 03:41 PM   Modules accepted: Orders

## 2013-07-25 ENCOUNTER — Ambulatory Visit: Payer: No Typology Code available for payment source | Attending: Internal Medicine

## 2013-07-25 NOTE — Patient Instructions (Signed)
Pt instructed to continue taking prescribed bp meds and return in 1 week Informed to have bp machine re calibrated for accuracy.

## 2013-07-25 NOTE — Progress Notes (Unsigned)
   Subjective:    Patient ID: Katie Rhodes, female    DOB: Dec 10, 1955, 58 y.o.   MRN: [id number]  HPI    Review of Systems     Objective:   Physical Exam        Assessment & Plan:  Pt here for blood pressure recheck s/p HTN crisis 07/18/13 Norvasc increased to 10 mg tab Pt states she feels better and is monitoring BP at home Informed she needs to keep log of pressure Pt to return in 1 week for repeat

## 2013-08-01 ENCOUNTER — Ambulatory Visit: Payer: No Typology Code available for payment source | Attending: Internal Medicine | Admitting: Internal Medicine

## 2013-08-01 NOTE — Progress Notes (Unsigned)
Pt is here for a BP check only. Today her BP was 185/100. Pt was giving clonidine 0.1 will wait 20 minutes to recheck BP. After 20 mins BP recheck was 162/84. Pt is able to go home.

## 2013-08-02 ENCOUNTER — Telehealth: Payer: Self-pay | Admitting: Emergency Medicine

## 2013-08-02 NOTE — Telephone Encounter (Signed)
Spoke with pt husband for translation of Arabic language. Informed pt of lab results and scheduled lab appt for repeat TSH,t3-t4

## 2013-08-08 ENCOUNTER — Ambulatory Visit: Payer: No Typology Code available for payment source

## 2013-08-18 ENCOUNTER — Ambulatory Visit: Payer: No Typology Code available for payment source | Attending: Internal Medicine

## 2013-08-18 DIAGNOSIS — E079 Disorder of thyroid, unspecified: Secondary | ICD-10-CM

## 2013-08-19 LAB — T4, FREE: Free T4: 1.29 ng/dL (ref 0.80–1.80)

## 2013-08-19 LAB — T3, FREE: T3, Free: 3.2 pg/mL (ref 2.3–4.2)

## 2013-08-19 LAB — TSH: TSH: 0.168 u[IU]/mL — AB (ref 0.350–4.500)

## 2013-08-24 ENCOUNTER — Telehealth: Payer: Self-pay | Admitting: *Deleted

## 2013-08-24 NOTE — Telephone Encounter (Signed)
Spoke to the pt's husband while pt was right there and informed them that her lab results were normal.

## 2013-08-24 NOTE — Telephone Encounter (Signed)
Message copied by Joan Mayans on Thu Aug 24, 2013  4:03 PM ------      Message from: Tresa Garter      Created: Tue Aug 22, 2013  5:19 PM       Please inform patient that her laboratory tests for thyroid function are mostly within normal limit ------

## 2013-09-18 ENCOUNTER — Encounter: Payer: Self-pay | Admitting: Internal Medicine

## 2013-09-18 ENCOUNTER — Ambulatory Visit: Payer: No Typology Code available for payment source | Attending: Internal Medicine | Admitting: Internal Medicine

## 2013-09-18 VITALS — BP 156/87 | HR 92 | Temp 98.7°F | Resp 16 | Ht 65.0 in | Wt 233.0 lb

## 2013-09-18 DIAGNOSIS — E119 Type 2 diabetes mellitus without complications: Secondary | ICD-10-CM | POA: Insufficient documentation

## 2013-09-18 DIAGNOSIS — R600 Localized edema: Secondary | ICD-10-CM

## 2013-09-18 DIAGNOSIS — E1310 Other specified diabetes mellitus with ketoacidosis without coma: Secondary | ICD-10-CM

## 2013-09-18 DIAGNOSIS — I10 Essential (primary) hypertension: Secondary | ICD-10-CM | POA: Insufficient documentation

## 2013-09-18 DIAGNOSIS — R609 Edema, unspecified: Secondary | ICD-10-CM

## 2013-09-18 DIAGNOSIS — E1110 Type 2 diabetes mellitus with ketoacidosis without coma: Secondary | ICD-10-CM

## 2013-09-18 DIAGNOSIS — K029 Dental caries, unspecified: Secondary | ICD-10-CM | POA: Insufficient documentation

## 2013-09-18 DIAGNOSIS — H538 Other visual disturbances: Secondary | ICD-10-CM

## 2013-09-18 DIAGNOSIS — E785 Hyperlipidemia, unspecified: Secondary | ICD-10-CM | POA: Insufficient documentation

## 2013-09-18 DIAGNOSIS — Z09 Encounter for follow-up examination after completed treatment for conditions other than malignant neoplasm: Secondary | ICD-10-CM | POA: Insufficient documentation

## 2013-09-18 LAB — POCT GLYCOSYLATED HEMOGLOBIN (HGB A1C): HEMOGLOBIN A1C: 5.9

## 2013-09-18 LAB — GLUCOSE, POCT (MANUAL RESULT ENTRY): POC GLUCOSE: 116 mg/dL — AB (ref 70–99)

## 2013-09-18 MED ORDER — HYDROCHLOROTHIAZIDE 25 MG PO TABS: 25.0000 mg | ORAL_TABLET | Freq: Every day | ORAL | Status: DC

## 2013-09-18 NOTE — Progress Notes (Signed)
Patient ID: Katie Rhodes, female   DOB: January 15, 1956, 58 y.o.   MRN: [id number]   Katie Rhodes, is a 58 y.o. female  [id number]  [id number]  DOB - [birth year]  Chief Complaint  Patient presents with  . Follow-up        Subjective:   Katie Rhodes is a 58 y.o. female here today for a follow up visit. She has history of diabetes mellitus, hypertension, hyperlipidemia and thyroid disease, on thyroid medication. Her major concern today is bilateral lower leg swelling usually after walking some distance, she does not endorse shortness of breath or easy fatigability. She uses glasses and she has been having blurry vision lately she thinks it may be time for her eye check. She also reported to have some dental pain and is requesting to see a dentist. She denies any fever, chills, or sore throat. Patient has No headache, No chest pain, No abdominal pain - No Nausea, No new weakness tingling or numbness, No Cough - SOB.  Problem  Bilateral Leg Edema  DM (Diabetes Mellitus) Type 2, Uncontrolled, With Ketoacidosis  Blurry Vision, Right Eye  Dental Caries    ALLERGIES: No Known Allergies  PAST MEDICAL HISTORY: Past Medical History  Diagnosis Date  . Hypertension   . Diabetes mellitus   . Thyroid disease     MEDICATIONS AT HOME: Prior to Admission medications   Medication Sig Start Date End Date Taking? Authorizing Provider  aspirin 81 MG tablet Take 1 tablet (81 mg total) by mouth daily. 06/12/13  Yes Lorayne Marek, MD  bisacodyl (DULCOLAX) 5 MG EC tablet Take 5 mg by mouth 2 (two) times daily as needed. For constipation    Yes Historical Provider, MD  Blood Glucose Monitoring Suppl (BLOOD GLUCOSE METER) kit Use as instructed 07/18/13  Yes Angelica Chessman, MD  Calcium Carbonate-Vitamin D (CALCIUM 500+D PO) Take by mouth.   Yes Historical Provider, MD  Cholecalciferol (VITAMIN D-3 PO) Take by mouth.   Yes Historical Provider, MD  cholestyramine light (PREVALITE) 4 G packet Take 4 g by mouth  2 (two) times daily.   Yes Historical Provider, MD  colesevelam (WELCHOL) 625 MG tablet Take 1,250 mg by mouth 3 (three) times daily.     Yes Historical Provider, MD  Flaxseed, Linseed, (FLAX SEED OIL PO) Take 1,000 mg by mouth daily.     Yes Historical Provider, MD  gabapentin (NEURONTIN) 100 MG capsule Take 1 capsule (100 mg total) by mouth 3 (three) times daily. 06/12/13  Yes Lorayne Marek, MD  Glucosamine-Chondroit-Vit C-Mn (GLUCOSAMINE CHONDROITIN COMPLX) CAPS Take 1 capsule by mouth daily.     Yes Historical Provider, MD  glucose blood test strip Use as instructed 07/18/13  Yes Angelica Chessman, MD  levothyroxine (SYNTHROID, LEVOTHROID) 150 MCG tablet Take 1 tablet (150 mcg total) by mouth daily. 06/12/13  Yes Lorayne Marek, MD  losartan (COZAAR) 25 MG tablet Take 1 tablet (25 mg total) by mouth daily. 06/12/13  Yes Lorayne Marek, MD  meloxicam (MOBIC) 7.5 MG tablet Take 1 tablet (7.5 mg total) by mouth daily. 06/12/13  Yes Lorayne Marek, MD  metFORMIN (GLUCOPHAGE) 500 MG tablet Take 1 tablet (500 mg total) by mouth daily with breakfast. 06/12/13  Yes Deepak Advani, MD  methocarbamol (ROBAXIN) 500 MG tablet Take 500 mg by mouth 2 (two) times daily.   Yes Historical Provider, MD  omeprazole (PRILOSEC) 20 MG capsule Take 20 mg by mouth daily.     Yes Historical Provider, MD  simvastatin (ZOCOR) 20  MG tablet Take 1 tablet (20 mg total) by mouth daily. 06/12/13  Yes Deepak Advani, MD  TRUEPLUS LANCETS 28G MISC Use to test blood sugars daily as directed by physician 07/18/13  Yes Olugbemiga Jegede, MD  hydrochlorothiazide (HYDRODIURIL) 25 MG tablet Take 1 tablet (25 mg total) by mouth daily. 09/18/13   Olugbemiga Jegede, MD     Objective:   Filed Vitals:   09/18/13 1520  BP: 156/87  Pulse: 92  Temp: 98.7 F (37.1 C)  TempSrc: Oral  Resp: 16  Height: 5' 5" (1.651 m)  Weight: 233 lb (105.688 kg)  SpO2: 99%    Exam General appearance : Awake, alert, not in any distress. Speech Clear.  Not toxic looking HEENT: Atraumatic and Normocephalic, pupils equally reactive to light and accomodation Neck: supple, no JVD. No cervical lymphadenopathy.  Chest:Good air entry bilaterally, no added sounds  CVS: S1 S2 regular, no murmurs.  Abdomen: Bowel sounds present, Non tender and not distended with no gaurding, rigidity or rebound. Extremities: B/L Lower Ext edema ++, both legs are warm to touch Neurology: Awake alert, and oriented X 3, CN II-XII intact, Non focal Skin:No Rash  Data Review Lab Results  Component Value Date   HGBA1C 5.9 09/18/2013   HGBA1C 5.9 06/12/2013     Assessment & Plan   1. DM (diabetes mellitus) type 2, uncontrolled, with ketoacidosis  - Glucose (CBG) - HgB A1c remains 5.9%  2. Essential hypertension, benign Because of bilateral lower leg edema and uncontrolled hypertension I will add a diuretic  - hydrochlorothiazide (HYDRODIURIL) 25 MG tablet; Take 1 tablet (25 mg total) by mouth daily.  Dispense: 90 tablet; Refill: 3  3. Hyperlipidemia Continue simvastatin 20 mg tablet by mouth daily Continue flaxseed and WelChol  4. Bilateral leg edema  - 2D Echocardiogram with contrast; Future  5. Blurry vision, right eye  - Ambulatory referral to Optometry  6. Dental caries  - Ambulatory referral to Dentistry  Patient was extensively counseled on nutrition and exercise   Return in about 3 months (around 12/19/2013), or if symptoms worsen or fail to improve, for Routine Follow Up.  The patient was given clear instructions to go to ER or return to medical center if symptoms don't improve, worsen or new problems develop. The patient verbalized understanding. The patient was told to call to get lab results if they haven't heard anything in the next week.    JEGEDE, OLUGBEMIGA, MD, MHA, FACP, FAAP [facility] [place], [place] [phone]   09/18/2013, 4:16 PM 

## 2013-09-18 NOTE — Progress Notes (Signed)
Pt is here following up on her HTN and diabetes. Pt reports having swelling around her legs

## 2013-09-25 ENCOUNTER — Other Ambulatory Visit: Payer: Self-pay | Admitting: Internal Medicine

## 2013-09-26 ENCOUNTER — Ambulatory Visit (HOSPITAL_COMMUNITY)
Admission: RE | Admit: 2013-09-26 | Discharge: 2013-09-26 | Disposition: A | Discharge status: Home / Self Care | Payer: No Typology Code available for payment source | Source: Ambulatory Visit | Attending: Internal Medicine | Admitting: Internal Medicine

## 2013-09-26 DIAGNOSIS — R600 Localized edema: Secondary | ICD-10-CM

## 2013-09-26 DIAGNOSIS — K219 Gastro-esophageal reflux disease without esophagitis: Secondary | ICD-10-CM | POA: Insufficient documentation

## 2013-09-26 DIAGNOSIS — E119 Type 2 diabetes mellitus without complications: Secondary | ICD-10-CM | POA: Insufficient documentation

## 2013-09-26 DIAGNOSIS — I10 Essential (primary) hypertension: Secondary | ICD-10-CM | POA: Insufficient documentation

## 2013-09-26 DIAGNOSIS — I517 Cardiomegaly: Secondary | ICD-10-CM

## 2013-09-26 DIAGNOSIS — E785 Hyperlipidemia, unspecified: Secondary | ICD-10-CM | POA: Insufficient documentation

## 2013-09-26 DIAGNOSIS — I509 Heart failure, unspecified: Secondary | ICD-10-CM | POA: Insufficient documentation

## 2013-09-26 NOTE — Progress Notes (Signed)
*  PRELIMINARY RESULTS* Echocardiogram 2D Echocardiogram has been performed.  Katie Rhodes 09/26/2013, 10:48 AM

## 2013-10-02 ENCOUNTER — Ambulatory Visit: Payer: No Typology Code available for payment source

## 2013-10-03 ENCOUNTER — Telehealth: Payer: Self-pay | Admitting: Emergency Medicine

## 2013-10-03 NOTE — Telephone Encounter (Signed)
Pt husband given echo results to interpret Arabic language

## 2013-10-03 NOTE — Telephone Encounter (Signed)
Message copied by Ricci Barker on Tue Oct 03, 2013 11:24 AM ------      Message from: Tresa Garter      Created: Sun Oct 01, 2013  8:42 PM       Please inform patient that her echocardiogram result was normal ------

## 2013-10-17 ENCOUNTER — Other Ambulatory Visit: Payer: Self-pay | Admitting: Internal Medicine

## 2013-11-02 ENCOUNTER — Emergency Department (HOSPITAL_COMMUNITY)
Admission: EM | Admit: 2013-11-02 | Discharge: 2013-11-02 | Disposition: A | Discharge status: Home / Self Care | Payer: No Typology Code available for payment source | Source: Home / Self Care | Attending: Family Medicine | Admitting: Family Medicine

## 2013-11-02 ENCOUNTER — Encounter (HOSPITAL_COMMUNITY): Payer: Self-pay | Admitting: Emergency Medicine

## 2013-11-02 DIAGNOSIS — M5430 Sciatica, unspecified side: Secondary | ICD-10-CM

## 2013-11-02 MED ORDER — TRAMADOL HCL 50 MG PO TABS: 50.0000 mg | ORAL_TABLET | Freq: Four times a day (QID) | ORAL | Status: DC | PRN

## 2013-11-02 MED ORDER — PREDNISONE 10 MG PO KIT: PACK | ORAL | Status: DC

## 2013-11-02 NOTE — ED Provider Notes (Signed)
Katie Rhodes is a 58 y.o. female who presents to Urgent Care today for low back pain. Patient has experienced one week of left-sided low back pain. 2 days ago the pain began radiating to her left leg or calf. The pain is worse with motion better with rest. She denies any weakness numbness bowel bladder dysfunction or difficulty walking. She's tried meloxicam and Robaxin which have not worked very well. No fevers or chills nausea vomiting or diarrhea. Patient denies any injury history.  Past Medical History  Diagnosis Date  . Hypertension   . Diabetes mellitus   . Thyroid disease    History  Substance Use Topics  . Smoking status: Never Smoker   . Smokeless tobacco: Not on file  . Alcohol Use: No   ROS as above Medications: No current facility-administered medications for this encounter.   Current Outpatient Prescriptions  Medication Sig Dispense Refill  . aspirin 81 MG tablet Take 1 tablet (81 mg total) by mouth daily.  30 tablet  3  . bisacodyl (DULCOLAX) 5 MG EC tablet Take 5 mg by mouth 2 (two) times daily as needed. For constipation       . Blood Glucose Monitoring Suppl (BLOOD GLUCOSE METER) kit Use as instructed  1 each  0  . Calcium Carbonate-Vitamin D (CALCIUM 500+D PO) Take by mouth.      . Cholecalciferol (VITAMIN D-3 PO) Take by mouth.      . cholestyramine light (PREVALITE) 4 G packet Take 4 g by mouth 2 (two) times daily.      . colesevelam (WELCHOL) 625 MG tablet Take 1,250 mg by mouth 3 (three) times daily.        . Flaxseed, Linseed, (FLAX SEED OIL PO) Take 1,000 mg by mouth daily.        Marland Kitchen gabapentin (NEURONTIN) 100 MG capsule TAKE 1 CAPSULE BY MOUTH 3 TIMES DAILY.  90 capsule  1  . Glucosamine-Chondroit-Vit C-Mn (GLUCOSAMINE CHONDROITIN COMPLX) CAPS Take 1 capsule by mouth daily.        Marland Kitchen glucose blood test strip Use as instructed  100 each  12  . hydrochlorothiazide (HYDRODIURIL) 25 MG tablet Take 1 tablet (25 mg total) by mouth daily.  90 tablet  3  . levothyroxine  (SYNTHROID, LEVOTHROID) 150 MCG tablet Take 1 tablet (150 mcg total) by mouth daily.  30 tablet  3  . losartan (COZAAR) 25 MG tablet Take 1 tablet (25 mg total) by mouth daily.  30 tablet  3  . metFORMIN (GLUCOPHAGE) 500 MG tablet Take 1 tablet (500 mg total) by mouth daily with breakfast.  90 tablet  1  . methocarbamol (ROBAXIN) 500 MG tablet Take 500 mg by mouth 2 (two) times daily.      Marland Kitchen omeprazole (PRILOSEC) 20 MG capsule TAKE 1 CAPSULE BY MOUTH ONCE DAILY  30 capsule  3  . PredniSONE 10 MG KIT 12 day dose pack po  1 kit  0  . simvastatin (ZOCOR) 20 MG tablet Take 1 tablet (20 mg total) by mouth daily.  30 tablet  3  . traMADol (ULTRAM) 50 MG tablet Take 1 tablet (50 mg total) by mouth every 6 (six) hours as needed.  15 tablet  0  . TRUEPLUS LANCETS 28G MISC Use to test blood sugars daily as directed by physician  100 each  5    Exam:  BP 156/95  Pulse 73  Temp(Src) 97.8 F (36.6 C) (Oral)  Resp 18  SpO2 99% Gen: Well  NAD Exts: Brisk capillary refill, warm and well perfused.  Back: Nontender to spinal midline. Fullback range of motion. Positive left-sided straight leg raise test. Negative right-sided straight leg raise test and Faber test bilaterally.  Strength is intact throughout bilateral lower extremities. Reflexes are diminished but equal bilateral knees and ankles. Capillary fill and sensation is intact.   No results found for this or any previous visit (from the past 24 hour(s)). No results found.  Assessment and Plan: 58 y.o. female with lumbago and sciatica. Likely due to degenerative disc disease. Plan to treat with prednisone course, and tramadol and Robaxin.  Followup with primary care provider in about 2 weeks if not improving.   Discussed warning signs or symptoms. Please see discharge instructions. Patient expresses understanding.    Gregor Hams, MD 11/02/13 [phone]

## 2013-11-02 NOTE — Discharge Instructions (Signed)
Thank you for coming in today. Take prednisone daily for 12 days.  Use tramadol for pain as needed.  Continue methocarbamol 500.  Stop meloxicam.  Come back or go to the emergency room if you notice new weakness new numbness problems walking or bowel or bladder problems.  Sciatica Sciatica is pain, weakness, numbness, or tingling along the path of the sciatic nerve. The nerve starts in the lower back and runs down the back of each leg. The nerve controls the muscles in the lower leg and in the back of the knee, while also providing sensation to the back of the thigh, lower leg, and the sole of your foot. Sciatica is a symptom of another medical condition. For instance, nerve damage or certain conditions, such as a herniated disk or bone spur on the spine, pinch or put pressure on the sciatic nerve. This causes the pain, weakness, or other sensations normally associated with sciatica. Generally, sciatica only affects one side of the body. CAUSES   Herniated or slipped disc.  Degenerative disk disease.  A pain disorder involving the narrow muscle in the buttocks (piriformis syndrome).  Pelvic injury or fracture.  Pregnancy.  Tumor (rare). SYMPTOMS  Symptoms can vary from mild to very severe. The symptoms usually travel from the low back to the buttocks and down the back of the leg. Symptoms can include:  Mild tingling or dull aches in the lower back, leg, or hip.  Numbness in the back of the calf or sole of the foot.  Burning sensations in the lower back, leg, or hip.  Sharp pains in the lower back, leg, or hip.  Leg weakness.  Severe back pain inhibiting movement. These symptoms may get worse with coughing, sneezing, laughing, or prolonged sitting or standing. Also, being overweight may worsen symptoms. DIAGNOSIS  Your caregiver will perform a physical exam to look for common symptoms of sciatica. He or she may ask you to do certain movements or activities that would trigger  sciatic nerve pain. Other tests may be performed to find the cause of the sciatica. These may include:  Blood tests.  X-rays.  Imaging tests, such as an MRI or CT scan. TREATMENT  Treatment is directed at the cause of the sciatic pain. Sometimes, treatment is not necessary and the pain and discomfort goes away on its own. If treatment is needed, your caregiver may suggest:  Over-the-counter medicines to relieve pain.  Prescription medicines, such as anti-inflammatory medicine, muscle relaxants, or narcotics.  Applying heat or ice to the painful area.  Steroid injections to lessen pain, irritation, and inflammation around the nerve.  Reducing activity during periods of pain.  Exercising and stretching to strengthen your abdomen and improve flexibility of your spine. Your caregiver may suggest losing weight if the extra weight makes the back pain worse.  Physical therapy.  Surgery to eliminate what is pressing or pinching the nerve, such as a bone spur or part of a herniated disk. HOME CARE INSTRUCTIONS   Only take over-the-counter or prescription medicines for pain or discomfort as directed by your caregiver.  Apply ice to the affected area for 20 minutes, 3 4 times a day for the first 48 72 hours. Then try heat in the same way.  Exercise, stretch, or perform your usual activities if these do not aggravate your pain.  Attend physical therapy sessions as directed by your caregiver.  Keep all follow-up appointments as directed by your caregiver.  Do not wear high heels or shoes that  do not provide proper support.  Check your mattress to see if it is too soft. A firm mattress may lessen your pain and discomfort. SEEK IMMEDIATE MEDICAL CARE IF:   You lose control of your bowel or bladder (incontinence).  You have increasing weakness in the lower back, pelvis, buttocks, or legs.  You have redness or swelling of your back.  You have a burning sensation when you  urinate.  You have pain that gets worse when you lie down or awakens you at night.  Your pain is worse than you have experienced in the past.  Your pain is lasting longer than 4 weeks.  You are suddenly losing weight without reason. MAKE SURE YOU:  Understand these instructions.  Will watch your condition.  Will get help right away if you are not doing well or get worse. Document Released: 06/30/2001 Document Revised: 01/05/2012 Document Reviewed: 11/15/2011 [facility] Patient Information 2014 [place].

## 2013-11-02 NOTE — ED Notes (Signed)
C/o lower back pain which is radiating down left leg for a week now which increased last night States she is not able to stand straight Denies any urinary problems Denies any injuries Has stretched and used prescribed meds as tx

## 2013-11-20 ENCOUNTER — Encounter: Payer: Self-pay | Admitting: Internal Medicine

## 2013-11-20 ENCOUNTER — Ambulatory Visit: Payer: No Typology Code available for payment source | Attending: Internal Medicine | Admitting: Internal Medicine

## 2013-11-20 VITALS — BP 178/97 | HR 93 | Temp 98.0°F | Resp 16 | Ht 63.0 in | Wt 226.0 lb

## 2013-11-20 DIAGNOSIS — E785 Hyperlipidemia, unspecified: Secondary | ICD-10-CM

## 2013-11-20 DIAGNOSIS — I10 Essential (primary) hypertension: Secondary | ICD-10-CM

## 2013-11-20 DIAGNOSIS — M545 Low back pain, unspecified: Secondary | ICD-10-CM | POA: Insufficient documentation

## 2013-11-20 DIAGNOSIS — E079 Disorder of thyroid, unspecified: Secondary | ICD-10-CM

## 2013-11-20 DIAGNOSIS — E1310 Other specified diabetes mellitus with ketoacidosis without coma: Secondary | ICD-10-CM

## 2013-11-20 DIAGNOSIS — E119 Type 2 diabetes mellitus without complications: Secondary | ICD-10-CM

## 2013-11-20 DIAGNOSIS — M25559 Pain in unspecified hip: Secondary | ICD-10-CM | POA: Insufficient documentation

## 2013-11-20 DIAGNOSIS — E1110 Type 2 diabetes mellitus with ketoacidosis without coma: Secondary | ICD-10-CM

## 2013-11-20 LAB — GLUCOSE, POCT (MANUAL RESULT ENTRY): POC Glucose: 205 mg/dl — AB (ref 70–99)

## 2013-11-20 MED ORDER — OMEPRAZOLE 20 MG PO CPDR: 20.0000 mg | DELAYED_RELEASE_CAPSULE | Freq: Every day | ORAL | Status: DC

## 2013-11-20 MED ORDER — GABAPENTIN 100 MG PO CAPS: 100.0000 mg | ORAL_CAPSULE | Freq: Three times a day (TID) | ORAL | Status: DC

## 2013-11-20 MED ORDER — LOSARTAN POTASSIUM 50 MG PO TABS: 50.0000 mg | ORAL_TABLET | Freq: Every day | ORAL | Status: DC

## 2013-11-20 MED ORDER — DICLOFENAC SODIUM 1 % TD GEL: 2.0000 g | Freq: Four times a day (QID) | TRANSDERMAL | Status: DC

## 2013-11-20 MED ORDER — SIMVASTATIN 20 MG PO TABS: 20.0000 mg | ORAL_TABLET | Freq: Every day | ORAL | Status: DC

## 2013-11-20 MED ORDER — TRAMADOL HCL 50 MG PO TABS: 50.0000 mg | ORAL_TABLET | Freq: Four times a day (QID) | ORAL | Status: DC | PRN

## 2013-11-20 MED ORDER — CYCLOBENZAPRINE HCL 10 MG PO TABS: 10.0000 mg | ORAL_TABLET | Freq: Three times a day (TID) | ORAL | Status: AC | PRN

## 2013-11-20 MED ORDER — METFORMIN HCL 500 MG PO TABS: 500.0000 mg | ORAL_TABLET | Freq: Every day | ORAL | Status: DC

## 2013-11-20 MED ORDER — LEVOTHYROXINE SODIUM 150 MCG PO TABS: 150.0000 ug | ORAL_TABLET | Freq: Every day | ORAL | Status: DC

## 2013-11-20 MED ORDER — HYDROCHLOROTHIAZIDE 25 MG PO TABS: 25.0000 mg | ORAL_TABLET | Freq: Every day | ORAL | Status: DC

## 2013-11-20 MED ORDER — GLUCOSE BLOOD VI STRP: ORAL_STRIP | Status: DC

## 2013-11-20 NOTE — Progress Notes (Signed)
Pt is here following up on her diabetes. Pt states that she is having pain in her right hip and lower back.

## 2013-11-20 NOTE — Progress Notes (Signed)
Patient ID: Katie Rhodes, female   DOB: Mar 10, 1956, 58 y.o.   MRN: [id number]   Katie Rhodes, is a 58 y.o. female  [id number]  [id number]  DOB - [birth year]  Chief Complaint  Patient presents with  . Follow-up        Subjective:   Katie Rhodes is a 58 y.o. female here today for a follow up visit. She has history of diabetes mellitus, hypertension, hyperlipidemia and thyroid disease, on thyroid medication. She is here today for follow up on to refill her medications. She still has bilateral lower leg pain and back pain, she needs a refill on her tramadol. She does not smoke cigarette, she does not drink alcohol. Patient has No headache, No chest pain, No abdominal pain - No Nausea, No new weakness tingling or numbness, No Cough - SOB.  Problem  Low Back Pain    ALLERGIES: No Known Allergies  PAST MEDICAL HISTORY: Past Medical History  Diagnosis Date  . Hypertension   . Diabetes mellitus   . Thyroid disease     MEDICATIONS AT HOME: Prior to Admission medications   Medication Sig Start Date End Date Taking? Authorizing Provider  aspirin 81 MG tablet Take 1 tablet (81 mg total) by mouth daily. 06/12/13  Yes Lorayne Marek, MD  Blood Glucose Monitoring Suppl (BLOOD GLUCOSE METER) kit Use as instructed 07/18/13  Yes Angelica Chessman, MD  Calcium Carbonate-Vitamin D (CALCIUM 500+D PO) Take by mouth.   Yes Historical Provider, MD  Cholecalciferol (VITAMIN D-3 PO) Take by mouth.   Yes Historical Provider, MD  diclofenac (VOLTAREN) 25 MG EC tablet Take 25 mg by mouth 2 (two) times daily.   Yes Historical Provider, MD  gabapentin (NEURONTIN) 100 MG capsule Take 1 capsule (100 mg total) by mouth 3 (three) times daily. 11/20/13  Yes Angelica Chessman, MD  Glucosamine-Chondroit-Vit C-Mn (GLUCOSAMINE CHONDROITIN COMPLX) CAPS Take 1 capsule by mouth daily.     Yes Historical Provider, MD  glucose blood test strip Use as instructed 11/20/13  Yes Angelica Chessman, MD  hydrochlorothiazide  (HYDRODIURIL) 25 MG tablet Take 1 tablet (25 mg total) by mouth daily. 11/20/13  Yes Angelica Chessman, MD  levothyroxine (SYNTHROID, LEVOTHROID) 150 MCG tablet Take 1 tablet (150 mcg total) by mouth daily. 11/20/13  Yes Angelica Chessman, MD  losartan (COZAAR) 50 MG tablet Take 1 tablet (50 mg total) by mouth daily. 11/20/13  Yes Angelica Chessman, MD  metFORMIN (GLUCOPHAGE) 500 MG tablet Take 1 tablet (500 mg total) by mouth daily with breakfast. 11/20/13  Yes Angelica Chessman, MD  omeprazole (PRILOSEC) 20 MG capsule Take 1 capsule (20 mg total) by mouth daily. 11/20/13  Yes Angelica Chessman, MD  simvastatin (ZOCOR) 20 MG tablet Take 1 tablet (20 mg total) by mouth daily. 11/20/13  Yes Angelica Chessman, MD  traMADol (ULTRAM) 50 MG tablet Take 1 tablet (50 mg total) by mouth every 6 (six) hours as needed. 11/20/13  Yes Angelica Chessman, MD  TRUEPLUS LANCETS 28G MISC Use to test blood sugars daily as directed by physician 07/18/13  Yes Angelica Chessman, MD  bisacodyl (DULCOLAX) 5 MG EC tablet Take 5 mg by mouth 2 (two) times daily as needed. For constipation     Historical Provider, MD  cholestyramine light (PREVALITE) 4 G packet Take 4 g by mouth 2 (two) times daily.    Historical Provider, MD  colesevelam (WELCHOL) 625 MG tablet Take 1,250 mg by mouth 3 (three) times daily.      Historical Provider, MD  cyclobenzaprine (FLEXERIL) 10 MG tablet Take 1 tablet (10 mg total) by mouth 3 (three) times daily as needed for muscle spasms. 11/20/13   Angelica Chessman, MD  diclofenac sodium (VOLTAREN) 1 % GEL Apply 2 g topically 4 (four) times daily. 11/20/13   Angelica Chessman, MD  Flaxseed, Linseed, (FLAX SEED OIL PO) Take 1,000 mg by mouth daily.      Historical Provider, MD     Objective:   Filed Vitals:   11/20/13 0935  BP: 178/97  Pulse: 93  Temp: 98 F (36.7 C)  TempSrc: Oral  Resp: 16  Height: '5\' 3"'  (1.6 m)  Weight: 226 lb (102.513 kg)  SpO2: 100%    Exam General appearance : Awake, alert, not in  any distress. Speech Clear. Not toxic looking HEENT: Atraumatic and Normocephalic, pupils equally reactive to light and accomodation Neck: supple, no JVD. No cervical lymphadenopathy.  Chest:Good air entry bilaterally, no added sounds  CVS: S1 S2 regular, no murmurs.  Abdomen: Bowel sounds present, Non tender and not distended with no gaurding, rigidity or rebound. Extremities: B/L Lower Ext shows no edema, both legs are warm to touch Neurology: Awake alert, and oriented X 3, CN II-XII intact, Non focal Skin:No Rash Wounds:N/A  Data Review Lab Results  Component Value Date   HGBA1C 5.9 09/18/2013   HGBA1C 5.9 06/12/2013     Assessment & Plan   1. Diabetes  - Glucose (CBG) - omeprazole (PRILOSEC) 20 MG capsule; Take 1 capsule (20 mg total) by mouth daily.  Dispense: 90 capsule; Refill: 3 - metFORMIN (GLUCOPHAGE) 500 MG tablet; Take 1 tablet (500 mg total) by mouth daily with breakfast.  Dispense: 90 tablet; Refill: 3 - glucose blood test strip; Use as instructed  Dispense: 100 each; Refill: 12  2. DM (diabetes mellitus) type 2, uncontrolled, with ketoacidosis Continue metformin  3. Thyroid disease  - levothyroxine (SYNTHROID, LEVOTHROID) 150 MCG tablet; Take 1 tablet (150 mcg total) by mouth daily.  Dispense: 90 tablet; Refill: 3  4. Hyperlipidemia  - simvastatin (ZOCOR) 20 MG tablet; Take 1 tablet (20 mg total) by mouth daily.  Dispense: 90 tablet; Refill: 3  5. Essential hypertension, benign  - losartan (COZAAR) 50 MG tablet; Take 1 tablet (50 mg total) by mouth daily.  Dispense: 90 tablet; Refill: 3 - hydrochlorothiazide (HYDRODIURIL) 25 MG tablet; Take 1 tablet (25 mg total) by mouth daily.  Dispense: 90 tablet; Refill: 3  6. Low back pain  - traMADol (ULTRAM) 50 MG tablet; Take 1 tablet (50 mg total) by mouth every 6 (six) hours as needed.  Dispense: 60 tablet; Refill: 0 - gabapentin (NEURONTIN) 100 MG capsule; Take 1 capsule (100 mg total) by mouth 3 (three) times  daily.  Dispense: 180 capsule; Refill: 3 - cyclobenzaprine (FLEXERIL) 10 MG tablet; Take 1 tablet (10 mg total) by mouth 3 (three) times daily as needed for muscle spasms.  Dispense: 60 tablet; Refill: 2 - diclofenac sodium (VOLTAREN) 1 % GEL; Apply 2 g topically 4 (four) times daily.  Dispense: 2 Tube; Refill: 2  Patient was counseled extensively on nutrition and exercise  Return in about 3 months (around 02/20/2014), or if symptoms worsen or fail to improve, for Hemoglobin A1C and Follow up, DM, Follow up HTN.  The patient was given clear instructions to go to ER or return to medical center if symptoms don't improve, worsen or new problems develop. The patient verbalized understanding. The patient was told to call to get lab results if they haven't  heard anything in the next week.   This note has been created with Surveyor, quantity. Any transcriptional errors are unintentional.    Angelica Chessman, MD, [place], [place], [place] [facility] [place], [place]   11/20/2013, 10:44 AM

## 2013-11-20 NOTE — Patient Instructions (Signed)
Diabetes and Exercise Exercising regularly is important. It is not just about losing weight. It has many health benefits, such as:  Improving your overall fitness, flexibility, and endurance.  Increasing your bone density.  Helping with weight control.  Decreasing your body fat.  Increasing your muscle strength.  Reducing stress and tension.  Improving your overall health. People with diabetes who exercise gain additional benefits because exercise:  Reduces appetite.  Improves the body's use of blood sugar (glucose).  Helps lower or control blood glucose.  Decreases blood pressure.  Helps control blood lipids (such as cholesterol and triglycerides).  Improves the body's use of the hormone insulin by:  Increasing the body's insulin sensitivity.  Reducing the body's insulin needs.  Decreases the risk for heart disease because exercising:  Lowers cholesterol and triglycerides levels.  Increases the levels of good cholesterol (such as high-density lipoproteins [HDL]) in the body.  Lowers blood glucose levels. YOUR ACTIVITY PLAN  Choose an activity that you enjoy and set realistic goals. Your health care provider or diabetes educator can help you make an activity plan that works for you. You can break activities into 2 or 3 sessions throughout the day. Doing so is as good as one long session. Exercise ideas include:  Taking the dog for a walk.  Taking the stairs instead of the elevator.  Dancing to your favorite song.  Doing your favorite exercise with a friend. RECOMMENDATIONS FOR EXERCISING WITH TYPE 1 OR TYPE 2 DIABETES   Check your blood glucose before exercising. If blood glucose levels are greater than 240 mg/dL, check for urine ketones. Do not exercise if ketones are present.  Avoid injecting insulin into areas of the body that are going to be exercised. For example, avoid injecting insulin into:  The arms when playing tennis.  The legs when  jogging.  Keep a record of:  Food intake before and after you exercise.  Expected peak times of insulin action.  Blood glucose levels before and after you exercise.  The type and amount of exercise you have done.  Review your records with your health care provider. Your health care provider will help you to develop guidelines for adjusting food intake and insulin amounts before and after exercising.  If you take insulin or oral hypoglycemic agents, watch for signs and symptoms of hypoglycemia. They include:  Dizziness.  Shaking.  Sweating.  Chills.  Confusion.  Drink plenty of water while you exercise to prevent dehydration or heat stroke. Body water is lost during exercise and must be replaced.  Talk to your health care provider before starting an exercise program to make sure it is safe for you. Remember, almost any type of activity is better than none. Document Released: 09/26/2003 Document Revised: 03/08/2013 Document Reviewed: 12/13/2012 Exit[employer] Patient Information 2014 [employer]. Hypertension As your heart beats, it forces blood through your arteries. This force is your blood pressure. If the pressure is too high, it is called hypertension (HTN) or high blood pressure. HTN is dangerous because you may have it and not know it. High blood pressure may mean that your heart has to work harder to pump blood. Your arteries may be narrow or stiff. The extra work puts you at risk for heart disease, stroke, and other problems.  Blood pressure consists of two numbers, a higher number over a lower, 110/72, for example. It is stated as "110 over 72." The ideal is below 120 for the top number (systolic) and under 80 for the bottom (  diastolic). Write down your blood pressure today. You should pay close attention to your blood pressure if you have certain conditions such as:  Heart failure.  Prior heart attack.  Diabetes  Chronic kidney disease.  Prior stroke.  Multiple  risk factors for heart disease. To see if you have HTN, your blood pressure should be measured while you are seated with your arm held at the level of the heart. It should be measured at least twice. A one-time elevated blood pressure reading (especially in the Emergency Department) does not mean that you need treatment. There may be conditions in which the blood pressure is different between your right and left arms. It is important to see your caregiver soon for a recheck. Most people have essential hypertension which means that there is not a specific cause. This type of high blood pressure may be lowered by changing lifestyle factors such as:  Stress.  Smoking.  Lack of exercise.  Excessive weight.  Drug/tobacco/alcohol use.  Eating less salt. Most people do not have symptoms from high blood pressure until it has caused damage to the body. Effective treatment can often prevent, delay or reduce that damage. TREATMENT  When a cause has been identified, treatment for high blood pressure is directed at the cause. There are a large number of medications to treat HTN. These fall into several categories, and your caregiver will help you select the medicines that are best for you. Medications may have side effects. You should review side effects with your caregiver. If your blood pressure stays high after you have made lifestyle changes or started on medicines,   Your medication(s) may need to be changed.  Other problems may need to be addressed.  Be certain you understand your prescriptions, and know how and when to take your medicine.  Be sure to follow up with your caregiver within the time frame advised (usually within two weeks) to have your blood pressure rechecked and to review your medications.  If you are taking more than one medicine to lower your blood pressure, make sure you know how and at what times they should be taken. Taking two medicines at the same time can result in blood  pressure that is too low. SEEK IMMEDIATE MEDICAL CARE IF:  You develop a severe headache, blurred or changing vision, or confusion.  You have unusual weakness or numbness, or a faint feeling.  You have severe chest or abdominal pain, vomiting, or breathing problems. MAKE SURE YOU:   Understand these instructions.  Will watch your condition.  Will get help right away if you are not doing well or get worse. Document Released: 07/06/2005 Document Revised: 09/28/2011 Document Reviewed: 02/24/2008 Exit[employer] Patient Information 2014 [employer].  

## 2013-11-27 ENCOUNTER — Other Ambulatory Visit: Payer: Self-pay

## 2013-12-18 ENCOUNTER — Ambulatory Visit: Payer: No Typology Code available for payment source | Admitting: Internal Medicine

## 2014-03-15 ENCOUNTER — Ambulatory Visit: Payer: Self-pay | Admitting: Internal Medicine

## 2014-03-15 ENCOUNTER — Ambulatory Visit: Payer: Self-pay | Attending: Internal Medicine

## 2014-03-22 ENCOUNTER — Encounter: Payer: Self-pay | Admitting: Internal Medicine

## 2014-03-22 ENCOUNTER — Ambulatory Visit: Payer: Self-pay | Attending: Internal Medicine | Admitting: Internal Medicine

## 2014-03-22 VITALS — BP 151/86 | HR 72 | Temp 98.3°F | Resp 16 | Ht 65.0 in | Wt 221.0 lb

## 2014-03-22 DIAGNOSIS — Z23 Encounter for immunization: Secondary | ICD-10-CM

## 2014-03-22 DIAGNOSIS — Z7982 Long term (current) use of aspirin: Secondary | ICD-10-CM | POA: Insufficient documentation

## 2014-03-22 DIAGNOSIS — E119 Type 2 diabetes mellitus without complications: Secondary | ICD-10-CM | POA: Insufficient documentation

## 2014-03-22 DIAGNOSIS — Z79899 Other long term (current) drug therapy: Secondary | ICD-10-CM | POA: Insufficient documentation

## 2014-03-22 DIAGNOSIS — I10 Essential (primary) hypertension: Secondary | ICD-10-CM | POA: Insufficient documentation

## 2014-03-22 DIAGNOSIS — H269 Unspecified cataract: Secondary | ICD-10-CM | POA: Insufficient documentation

## 2014-03-22 DIAGNOSIS — E079 Disorder of thyroid, unspecified: Secondary | ICD-10-CM | POA: Insufficient documentation

## 2014-03-22 LAB — GLUCOSE, POCT (MANUAL RESULT ENTRY): POC Glucose: 123 mg/dl — AB (ref 70–99)

## 2014-03-22 LAB — POCT GLYCOSYLATED HEMOGLOBIN (HGB A1C): Hemoglobin A1C: 7.5

## 2014-03-22 MED ORDER — LOSARTAN POTASSIUM 50 MG PO TABS: 50.0000 mg | ORAL_TABLET | Freq: Every day | ORAL | Status: DC

## 2014-03-22 MED ORDER — HYDROCHLOROTHIAZIDE 25 MG PO TABS: 25.0000 mg | ORAL_TABLET | Freq: Every day | ORAL | Status: DC

## 2014-03-22 MED ORDER — METFORMIN HCL 500 MG PO TABS: 500.0000 mg | ORAL_TABLET | Freq: Every day | ORAL | Status: DC

## 2014-03-22 NOTE — Progress Notes (Signed)
Patient ID: Katie Rhodes, female   DOB: [birth year], 58 y.o.   MRN: [id number]  CC: follow up  HPI:  Patient presents to clinic today for a follow up of DM and HTN.  Patient admits that she was only taking Metformin 500 mg once per day instead of twice.  She states that she was in [place] for a few weeks and states that she was eating more rice and starches due to the limited selection of foods.  She states that she has been having difficulty with vision of right eye for past 20 years and the last eye exam revealed she has a cataract. She reports that she has been out of losartan and HCTZ for a couple of weeks. She reports that the last refill of medication was of amlodipine and she did not take it because it was discontinued by Dr. Doreene Burke a few months back.     No Known Allergies Past Medical History  Diagnosis Date  . Hypertension   . Diabetes mellitus   . Thyroid disease    Current Outpatient Prescriptions on File Prior to Visit  Medication Sig Dispense Refill  . bisacodyl (DULCOLAX) 5 MG EC tablet Take 5 mg by mouth 2 (two) times daily as needed. For constipation       . Blood Glucose Monitoring Suppl (BLOOD GLUCOSE METER) kit Use as instructed  1 each  0  . Calcium Carbonate-Vitamin D (CALCIUM 500+D PO) Take by mouth.      . Cholecalciferol (VITAMIN D-3 PO) Take by mouth.      . cholestyramine light (PREVALITE) 4 G packet Take 4 g by mouth 2 (two) times daily.      . cyclobenzaprine (FLEXERIL) 10 MG tablet Take 1 tablet (10 mg total) by mouth 3 (three) times daily as needed for muscle spasms.  60 tablet  2  . diclofenac (VOLTAREN) 25 MG EC tablet Take 25 mg by mouth 2 (two) times daily.      . diclofenac sodium (VOLTAREN) 1 % GEL Apply 2 g topically 4 (four) times daily.  2 Tube  2  . Flaxseed, Linseed, (FLAX SEED OIL PO) Take 1,000 mg by mouth daily.        Marland Kitchen gabapentin (NEURONTIN) 100 MG capsule Take 1 capsule (100 mg total) by mouth 3 (three) times daily.  180 capsule  3  .  Glucosamine-Chondroit-Vit C-Mn (GLUCOSAMINE CHONDROITIN COMPLX) CAPS Take 1 capsule by mouth daily.        Marland Kitchen glucose blood test strip Use as instructed  100 each  12  . hydrochlorothiazide (HYDRODIURIL) 25 MG tablet Take 1 tablet (25 mg total) by mouth daily.  90 tablet  3  . levothyroxine (SYNTHROID, LEVOTHROID) 150 MCG tablet Take 1 tablet (150 mcg total) by mouth daily.  90 tablet  3  . losartan (COZAAR) 50 MG tablet Take 1 tablet (50 mg total) by mouth daily.  90 tablet  3  . metFORMIN (GLUCOPHAGE) 500 MG tablet Take 1 tablet (500 mg total) by mouth daily with breakfast.  90 tablet  3  . omeprazole (PRILOSEC) 20 MG capsule Take 1 capsule (20 mg total) by mouth daily.  90 capsule  3  . simvastatin (ZOCOR) 20 MG tablet Take 1 tablet (20 mg total) by mouth daily.  90 tablet  3  . TRUEPLUS LANCETS 28G MISC Use to test blood sugars daily as directed by physician  100 each  5  . aspirin 81 MG tablet Take 1 tablet (81 mg  total) by mouth daily.  30 tablet  3  . colesevelam (WELCHOL) 625 MG tablet Take 1,250 mg by mouth 3 (three) times daily.        . traMADol (ULTRAM) 50 MG tablet Take 1 tablet (50 mg total) by mouth every 6 (six) hours as needed.  60 tablet  0   No current facility-administered medications on file prior to visit.   Family History  Problem Relation Age of Onset  . Thyroid disease Mother    History   Social History  . Marital Status: Married    Spouse Name: N/A    Number of Children: N/A  . Years of Education: N/A   Occupational History  . Not on file.   Social History Main Topics  . Smoking status: Never Smoker   . Smokeless tobacco: Not on file  . Alcohol Use: No  . Drug Use: No  . Sexual Activity: Not on file   Other Topics Concern  . Not on file   Social History Narrative  . No narrative on file    Review of Systems  Eyes:       See hpi   Respiratory: Negative.   Cardiovascular: Negative.   Genitourinary: Negative for frequency.  Neurological:  Negative for dizziness, tingling and headaches.  Endo/Heme/Allergies: Negative for polydipsia.       Objective:   Filed Vitals:   03/22/14 1716  BP: 151/86  Pulse: 72  Temp: 98.3 F (36.8 C)  Resp: 16    Physical Exam  Constitutional: She is oriented to person, place, and time.  Cardiovascular: Normal rate, regular rhythm and normal heart sounds.   Pulmonary/Chest: Effort normal and breath sounds normal.  Abdominal: Soft. Bowel sounds are normal.  Musculoskeletal: She exhibits no edema.  Neurological: She is alert and oriented to person, place, and time.  Skin: Skin is warm and dry.     Lab Results  Component Value Date   WBC 6.8 06/12/2013   HGB 13.3 06/12/2013   HCT 40.2 06/12/2013   MCV 72.2* 06/12/2013   PLT 226 06/12/2013   Lab Results  Component Value Date   CREATININE 0.57 06/12/2013   BUN 8 06/12/2013   NA 137 06/12/2013   K 4.5 06/12/2013   CL 101 06/12/2013   CO2 28 06/12/2013    Lab Results  Component Value Date   HGBA1C 7.5 03/22/2014   Lipid Panel     Component Value Date/Time   CHOL 188 06/12/2013 1523   TRIG 237* 06/12/2013 1523   HDL 53 06/12/2013 1523   CHOLHDL 3.5 06/12/2013 1523   VLDL 47* 06/12/2013 1523   LDLCALC 88 06/12/2013 1523       Assessment and plan:   [place] was seen today for follow-up.  Diagnoses and associated orders for this visit:  Type 2 diabetes mellitus without complication - Glucose (CBG) - HgB A1c - Refilled metFORMIN (GLUCOPHAGE) 500 MG tablet; Take 1 tablet (500 mg total) by mouth daily with breakfast. Stressed the importance of medication compliance. Reviewed complications of DM on various organ systems. Stressed diet control and exercise  Essential hypertension, benign refilled  hydrochlorothiazide (HYDRODIURIL) 25 MG tablet; Take 1 tablet (25 mg total) by mouth daily. - losartan (COZAAR) 50 MG tablet; Take 1 tablet (50 mg total) by mouth daily.   Return in about 3 months (around 06/21/2014) for  DM/HTN and 2 week nurse visit-BP check.       Chari Manning, [facility] [phone] 03/26/2014, 10:40  PM

## 2014-03-22 NOTE — Progress Notes (Signed)
Pt is here following up on her diabetes and HTN. Pt has questions about her amlodipine rx.

## 2014-04-05 ENCOUNTER — Ambulatory Visit: Payer: Self-pay | Attending: Internal Medicine | Admitting: *Deleted

## 2014-04-05 DIAGNOSIS — Z0130 Encounter for examination of blood pressure without abnormal findings: Secondary | ICD-10-CM

## 2014-04-05 DIAGNOSIS — Z79899 Other long term (current) drug therapy: Secondary | ICD-10-CM | POA: Insufficient documentation

## 2014-04-05 DIAGNOSIS — I10 Essential (primary) hypertension: Secondary | ICD-10-CM | POA: Insufficient documentation

## 2014-04-05 NOTE — Progress Notes (Signed)
Patient presents for BP check States she takes HCTZ and losartan daily as directed Patient brought BP log with her. Radings have been 107-133 / 76-91  BP 138/82 P 77 SPO2 99%  Patient instructed to request med refills at least a week  prior to running out so as not to go without these meds. Patient and daughter state understanding.

## 2014-05-14 ENCOUNTER — Encounter: Payer: Self-pay | Admitting: Internal Medicine

## 2014-05-14 ENCOUNTER — Ambulatory Visit: Payer: Self-pay | Attending: Internal Medicine | Admitting: Internal Medicine

## 2014-05-14 VITALS — BP 136/87 | HR 84 | Temp 98.6°F | Resp 16 | Ht 63.0 in | Wt 223.0 lb

## 2014-05-14 DIAGNOSIS — Z0000 Encounter for general adult medical examination without abnormal findings: Secondary | ICD-10-CM | POA: Insufficient documentation

## 2014-05-14 DIAGNOSIS — Z7982 Long term (current) use of aspirin: Secondary | ICD-10-CM | POA: Insufficient documentation

## 2014-05-14 DIAGNOSIS — M79661 Pain in right lower leg: Secondary | ICD-10-CM | POA: Insufficient documentation

## 2014-05-14 DIAGNOSIS — E119 Type 2 diabetes mellitus without complications: Secondary | ICD-10-CM | POA: Insufficient documentation

## 2014-05-14 DIAGNOSIS — K088 Other specified disorders of teeth and supporting structures: Secondary | ICD-10-CM | POA: Insufficient documentation

## 2014-05-14 DIAGNOSIS — Z79899 Other long term (current) drug therapy: Secondary | ICD-10-CM | POA: Insufficient documentation

## 2014-05-14 DIAGNOSIS — E039 Hypothyroidism, unspecified: Secondary | ICD-10-CM | POA: Insufficient documentation

## 2014-05-14 DIAGNOSIS — I10 Essential (primary) hypertension: Secondary | ICD-10-CM | POA: Insufficient documentation

## 2014-05-14 DIAGNOSIS — K0889 Other specified disorders of teeth and supporting structures: Secondary | ICD-10-CM

## 2014-05-14 LAB — POCT URINALYSIS DIPSTICK
Bilirubin, UA: NEGATIVE
Blood, UA: NEGATIVE
GLUCOSE UA: NEGATIVE
Ketones, UA: NEGATIVE
NITRITE UA: NEGATIVE
Protein, UA: NEGATIVE
Spec Grav, UA: 1.015
Urobilinogen, UA: 0.2
pH, UA: 7

## 2014-05-14 LAB — LIPID PANEL
CHOLESTEROL: 189 mg/dL (ref 0–200)
HDL: 55 mg/dL (ref >39)
LDL Cholesterol: 103 mg/dL — ABNORMAL HIGH (ref 0–99)
Total CHOL/HDL Ratio: 3.4 Ratio
Triglycerides: 153 mg/dL — ABNORMAL HIGH (ref <150)
VLDL: 31 mg/dL (ref 0–40)

## 2014-05-14 LAB — GLUCOSE, POCT (MANUAL RESULT ENTRY): POC Glucose: 132 mg/dl — AB (ref 70–99)

## 2014-05-14 MED ORDER — GABAPENTIN 300 MG PO CAPS: 300.0000 mg | ORAL_CAPSULE | Freq: Three times a day (TID) | ORAL | Status: AC

## 2014-05-14 NOTE — Progress Notes (Signed)
Pt is here c/o right leg pain with numbness and tingling radiating to her feet. Pt states that when she eats she has pain in her lower tooth. Pt reports that she is urinating white pieces.

## 2014-05-14 NOTE — Progress Notes (Signed)
Patient ID: Katie Rhodes, female   DOB: 08-Mar-1956, 58 y.o.   MRN: [id number]   Katie Rhodes, is a 58 y.o. female  XVQ:[id number]  [id number]  DOB - January 14, 1956  Chief Complaint  Patient presents with  . Follow-up        Subjective:   Katie Rhodes is a 58 y.o. female here today for a follow up visit. Patient has history of hypertension, diabetes mellitus and hypothyroidism, here today complaining of right leg pain with numbness and tingling radiating to her feet. She states that she has pain in her lower teeth when she eats. Patient also complained of symptoms of white tissue substances in her urine. She is due for colonoscopy. She claims compliant with her medications, reports no side effects, no hypoglycemic episode. She requested referral to dentist. Patient has No headache, No chest pain, No abdominal pain - No Nausea, No new weakness tingling or numbness, No Cough - SOB.  Problem  Preventative Health Care  Pain, Dental    ALLERGIES: No Known Allergies  PAST MEDICAL HISTORY: Past Medical History  Diagnosis Date  . Hypertension   . Diabetes mellitus   . Thyroid disease     MEDICATIONS AT HOME: Prior to Admission medications   Medication Sig Start Date End Date Taking? Authorizing Provider  aspirin 81 MG tablet Take 1 tablet (81 mg total) by mouth daily. 06/12/13  Yes Lorayne Marek, MD  Blood Glucose Monitoring Suppl (BLOOD GLUCOSE METER) kit Use as instructed 07/18/13  Yes Tresa Garter, MD  Calcium Carbonate-Vitamin D (CALCIUM 500+D PO) Take by mouth.   Yes Historical Provider, MD  Cholecalciferol (VITAMIN D-3 PO) Take by mouth.   Yes Historical Provider, MD  cyclobenzaprine (FLEXERIL) 10 MG tablet Take 1 tablet (10 mg total) by mouth 3 (three) times daily as needed for muscle spasms. 11/20/13  Yes Jefrey Raburn E Linah Klapper, MD  Flaxseed, Linseed, (FLAX SEED OIL PO) Take 1,000 mg by mouth daily.     Yes Historical Provider, MD  gabapentin (NEURONTIN) 300 MG capsule Take 1 capsule  (300 mg total) by mouth 3 (three) times daily. 05/14/14  Yes Tresa Garter, MD  glucose blood test strip Use as instructed 11/20/13  Yes Tresa Garter, MD  hydrochlorothiazide (HYDRODIURIL) 25 MG tablet Take 1 tablet (25 mg total) by mouth daily. 03/22/14  Yes Lance Bosch, NP  levothyroxine (SYNTHROID, LEVOTHROID) 150 MCG tablet Take 1 tablet (150 mcg total) by mouth daily. 11/20/13  Yes Tresa Garter, MD  losartan (COZAAR) 50 MG tablet Take 1 tablet (50 mg total) by mouth daily. 03/22/14  Yes Lance Bosch, NP  metFORMIN (GLUCOPHAGE) 500 MG tablet Take 1 tablet (500 mg total) by mouth daily with breakfast. 03/22/14  Yes Lance Bosch, NP  omeprazole (PRILOSEC) 20 MG capsule Take 1 capsule (20 mg total) by mouth daily. 11/20/13  Yes Tresa Garter, MD  simvastatin (ZOCOR) 20 MG tablet Take 1 tablet (20 mg total) by mouth daily. 11/20/13  Yes Tresa Garter, MD  traMADol (ULTRAM) 50 MG tablet Take 1 tablet (50 mg total) by mouth every 6 (six) hours as needed. 11/20/13  Yes Tresa Garter, MD  TRUEPLUS LANCETS 28G MISC Use to test blood sugars daily as directed by physician 07/18/13  Yes Tresa Garter, MD  bisacodyl (DULCOLAX) 5 MG EC tablet Take 5 mg by mouth 2 (two) times daily as needed. For constipation     Historical Provider, MD  cholestyramine light (PREVALITE) 4  G packet Take 4 g by mouth 2 (two) times daily.    Historical Provider, MD  colesevelam (WELCHOL) 625 MG tablet Take 1,250 mg by mouth 3 (three) times daily.      Historical Provider, MD  diclofenac (VOLTAREN) 25 MG EC tablet Take 25 mg by mouth 2 (two) times daily.    Historical Provider, MD  diclofenac sodium (VOLTAREN) 1 % GEL Apply 2 g topically 4 (four) times daily. 11/20/13   Tresa Garter, MD  Glucosamine-Chondroit-Vit C-Mn (GLUCOSAMINE CHONDROITIN COMPLX) CAPS Take 1 capsule by mouth daily.      Historical Provider, MD     Objective:   Filed Vitals:   05/14/14 1415  BP: 136/87  Pulse: 84    Temp: 98.6 F (37 C)  TempSrc: Oral  Resp: 16  Height: '5\' 3"'  (1.6 m)  Weight: 223 lb (101.152 kg)  SpO2: 100%    Exam General appearance : Awake, alert, not in any distress. Speech Clear. Not toxic looking HEENT: Atraumatic and Normocephalic, pupils equally reactive to light and accomodation Neck: supple, no JVD. No cervical lymphadenopathy.  Chest:Good air entry bilaterally, no added sounds  CVS: S1 S2 regular, no murmurs.  Abdomen: Bowel sounds present, Non tender and not distended with no gaurding, rigidity or rebound. Extremities: B/L Lower Ext shows no edema, both legs are warm to touch Neurology: Awake alert, and oriented X 3, CN II-XII intact, Non focal Skin:No Rash Wounds:N/A  Data Review Lab Results  Component Value Date   HGBA1C 7.5 03/22/2014   HGBA1C 5.9 09/18/2013   HGBA1C 5.9 06/12/2013     Assessment & Plan   1. Type 2 diabetes mellitus without complication  - Glucose (CBG) - Lipid panel - Vitamin D, 25-hydroxy - Microalbumin, urine - Ambulatory referral to Ophthalmology   Aim for 2-3 Carb Choices per meal (30-45 grams) +/- 1 either way  Aim for 0-15 Carbs per snack if hungry  Include protein in moderation with your meals and snacks  Consider reading food labels for Total Carbohydrate and Fat Grams of foods  Consider checking BG at alternate times per day  Continue taking medication as directed Fruit Punch - find one with no sugar  Measure and decrease portions of carbohydrate foods  Make your plate and don't go back for seconds   2. Preventative health care  - Urinalysis Dipstick is negative for UTI. - MM Digital Screening; Future - HM COLONOSCOPY - Ambulatory referral to Gastroenterology  3. Pain, dental  - Ambulatory referral to Dentistry  Return in about 3 months (around 08/14/2014), or if symptoms worsen or fail to improve, for Hemoglobin A1C and Follow up, DM, Follow up HTN.  The patient was given clear instructions to go to ER or  return to medical center if symptoms don't improve, worsen or new problems develop. The patient verbalized understanding. The patient was told to call to get lab results if they haven't heard anything in the next week.   This note has been created with Surveyor, quantity. Any transcriptional errors are unintentional.    Angelica Chessman, MD, [address], [place], [place] and [place], [place]   05/14/2014, 3:06 PM

## 2014-05-14 NOTE — Patient Instructions (Signed)
DASH Eating Plan DASH stands for "Dietary Approaches to Stop Hypertension." The DASH eating plan is a healthy eating plan that has been shown to reduce high blood pressure (hypertension). Additional health benefits may include reducing the risk of type 2 diabetes mellitus, heart disease, and stroke. The DASH eating plan may also help with weight loss. WHAT DO I NEED TO KNOW ABOUT THE DASH EATING PLAN? For the DASH eating plan, you will follow these general guidelines:  Choose foods with a percent daily value for sodium of less than 5% (as listed on the food label).  Use salt-free seasonings or herbs instead of table salt or sea salt.  Check with your health care provider or pharmacist before using salt substitutes.  Eat lower-sodium products, often labeled as "lower sodium" or "no salt added."  Eat fresh foods.  Eat more vegetables, fruits, and low-fat dairy products.  Choose whole grains. Look for the word "whole" as the first word in the ingredient list.  Choose fish and skinless chicken or turkey more often than red meat. Limit fish, poultry, and meat to 6 oz (170 g) each day.  Limit sweets, desserts, sugars, and sugary drinks.  Choose heart-healthy fats.  Limit cheese to 1 oz (28 g) per day.  Eat more home-cooked food and less restaurant, buffet, and fast food.  Limit fried foods.  Cook foods using methods other than frying.  Limit canned vegetables. If you do use them, rinse them well to decrease the sodium.  When eating at a restaurant, ask that your food be prepared with less salt, or no salt if possible. WHAT FOODS CAN I EAT? Seek help from a dietitian for individual calorie needs. Grains Whole grain or whole wheat bread. Brown rice. Whole grain or whole wheat pasta. Quinoa, bulgur, and whole grain cereals. Low-sodium cereals. Corn or whole wheat flour tortillas. Whole grain cornbread. Whole grain crackers. Low-sodium crackers. Vegetables Fresh or frozen vegetables  (raw, steamed, roasted, or grilled). Low-sodium or reduced-sodium tomato and vegetable juices. Low-sodium or reduced-sodium tomato sauce and paste. Low-sodium or reduced-sodium canned vegetables.  Fruits All fresh, canned (in natural juice), or frozen fruits. Meat and Other Protein Products Ground beef (85% or leaner), grass-fed beef, or beef trimmed of fat. Skinless chicken or turkey. Ground chicken or turkey. Pork trimmed of fat. All fish and seafood. Eggs. Dried beans, peas, or lentils. Unsalted nuts and seeds. Unsalted canned beans. Dairy Low-fat dairy products, such as skim or 1% milk, 2% or reduced-fat cheeses, low-fat ricotta or cottage cheese, or plain low-fat yogurt. Low-sodium or reduced-sodium cheeses. Fats and Oils Tub margarines without trans fats. Light or reduced-fat mayonnaise and salad dressings (reduced sodium). Avocado. Safflower, olive, or canola oils. Natural peanut or almond butter. Other Unsalted popcorn and pretzels. The items listed above may not be a complete list of recommended foods or beverages. Contact your dietitian for more options. WHAT FOODS ARE NOT RECOMMENDED? Grains White bread. White pasta. White rice. Refined cornbread. Bagels and croissants. Crackers that contain trans fat. Vegetables Creamed or fried vegetables. Vegetables in a cheese sauce. Regular canned vegetables. Regular canned tomato sauce and paste. Regular tomato and vegetable juices. Fruits Dried fruits. Canned fruit in light or heavy syrup. Fruit juice. Meat and Other Protein Products Fatty cuts of meat. Ribs, chicken wings, bacon, sausage, bologna, salami, chitterlings, fatback, hot dogs, bratwurst, and packaged luncheon meats. Salted nuts and seeds. Canned beans with salt. Dairy Whole or 2% milk, cream, half-and-half, and cream cheese. Whole-fat or sweetened yogurt. Full-fat   cheeses or blue cheese. Nondairy creamers and whipped toppings. Processed cheese, cheese spreads, or cheese  curds. Condiments Onion and garlic salt, seasoned salt, table salt, and sea salt. Canned and packaged gravies. Worcestershire sauce. Tartar sauce. Barbecue sauce. Teriyaki sauce. Soy sauce, including reduced sodium. Steak sauce. Fish sauce. Oyster sauce. Cocktail sauce. Horseradish. Ketchup and mustard. Meat flavorings and tenderizers. Bouillon cubes. Hot sauce. Tabasco sauce. Marinades. Taco seasonings. Relishes. Fats and Oils Butter, stick margarine, lard, shortening, ghee, and bacon fat. Coconut, palm kernel, or palm oils. Regular salad dressings. Other Pickles and olives. Salted popcorn and pretzels. The items listed above may not be a complete list of foods and beverages to avoid. Contact your dietitian for more information. WHERE CAN I FIND MORE INFORMATION? National Heart, Lung, and [employer] Institute: www.nhlbi.nih.gov/health/health-topics/topics/dash/ Document Released: 06/25/2011 Document Revised: 11/20/2013 Document Reviewed: 05/10/2013 ExitCare Patient Information 2015 [employer]. This information is not intended to replace advice given to you by your health care provider. Make sure you discuss any questions you have with your health care provider. Diabetes Mellitus and Food It is important for you to manage your blood sugar (glucose) level. Your blood glucose level can be greatly affected by what you eat. Eating healthier foods in the appropriate amounts throughout the day at about the same time each day will help you control your blood glucose level. It can also help slow or prevent worsening of your diabetes mellitus. Healthy eating may even help you improve the level of your blood pressure and reach or maintain a healthy weight.  HOW CAN FOOD AFFECT ME? Carbohydrates Carbohydrates affect your blood glucose level more than any other type of food. Your dietitian will help you determine how many carbohydrates to eat at each meal and teach you how to count carbohydrates. Counting  carbohydrates is important to keep your blood glucose at a healthy level, especially if you are using insulin or taking certain medicines for diabetes mellitus. Alcohol Alcohol can cause sudden decreases in blood glucose (hypoglycemia), especially if you use insulin or take certain medicines for diabetes mellitus. Hypoglycemia can be a life-threatening condition. Symptoms of hypoglycemia (sleepiness, dizziness, and disorientation) are similar to symptoms of having too much alcohol.  If your health care provider has given you approval to drink alcohol, do so in moderation and use the following guidelines:  Women should not have more than one drink per day, and men should not have more than two drinks per day. One drink is equal to:  12 oz of beer.  5 oz of wine.  1 oz of hard liquor.  Do not drink on an empty stomach.  Keep yourself hydrated. Have water, diet soda, or unsweetened iced tea.  Regular soda, juice, and other mixers might contain a lot of carbohydrates and should be counted. WHAT FOODS ARE NOT RECOMMENDED? As you make food choices, it is important to remember that all foods are not the same. Some foods have fewer nutrients per serving than other foods, even though they might have the same number of calories or carbohydrates. It is difficult to get your body what it needs when you eat foods with fewer nutrients. Examples of foods that you should avoid that are high in calories and carbohydrates but low in nutrients include:  Trans fats (most processed foods list trans fats on the Nutrition Facts label).  Regular soda.  Juice.  Candy.  Sweets, such as cake, pie, doughnuts, and cookies.  Fried foods. WHAT FOODS CAN I EAT? Have nutrient-rich foods,   which will nourish your body and keep you healthy. The food you should eat also will depend on several factors, including:  The calories you need.  The medicines you take.  Your weight.  Your blood glucose level.  Your  blood pressure level.  Your cholesterol level. You also should eat a variety of foods, including:  Protein, such as meat, poultry, fish, tofu, nuts, and seeds (lean animal proteins are best).  Fruits.  Vegetables.  Dairy products, such as milk, cheese, and yogurt (low fat is best).  Breads, grains, pasta, cereal, rice, and beans.  Fats such as olive oil, trans fat-free margarine, canola oil, avocado, and olives. DOES EVERYONE WITH DIABETES MELLITUS HAVE THE SAME MEAL PLAN? Because every person with diabetes mellitus is different, there is not one meal plan that works for everyone. It is very important that you meet with a dietitian who will help you create a meal plan that is just right for you. Document Released: 04/02/2005 Document Revised: 07/11/2013 Document Reviewed: 06/02/2013 ExitCare Patient Information 2015 [employer]. This information is not intended to replace advice given to you by your health care provider. Make sure you discuss any questions you have with your health care provider. Diabetes and Exercise Exercising regularly is important. It is not just about losing weight. It has many health benefits, such as:  Improving your overall fitness, flexibility, and endurance.  Increasing your bone density.  Helping with weight control.  Decreasing your body fat.  Increasing your muscle strength.  Reducing stress and tension.  Improving your overall health. People with diabetes who exercise gain additional benefits because exercise:  Reduces appetite.  Improves the body's use of blood sugar (glucose).  Helps lower or control blood glucose.  Decreases blood pressure.  Helps control blood lipids (such as cholesterol and triglycerides).  Improves the body's use of the hormone insulin by:  Increasing the body's insulin sensitivity.  Reducing the body's insulin needs.  Decreases the risk for heart disease because exercising:  Lowers cholesterol and  triglycerides levels.  Increases the levels of good cholesterol (such as high-density lipoproteins [HDL]) in the body.  Lowers blood glucose levels. YOUR ACTIVITY PLAN  Choose an activity that you enjoy and set realistic goals. Your health care provider or diabetes educator can help you make an activity plan that works for you. Exercise regularly as directed by your health care provider. This includes:  Performing resistance training twice a week such as push-ups, sit-ups, lifting weights, or using resistance bands.  Performing 150 minutes of cardio exercises each week such as walking, running, or playing sports.  Staying active and spending no more than 90 minutes at one time being inactive. Even short bursts of exercise are good for you. Three 10-minute sessions spread throughout the day are just as beneficial as a single 30-minute session. Some exercise ideas include:  Taking the dog for a walk.  Taking the stairs instead of the elevator.  Dancing to your favorite song.  Doing an exercise video.  Doing your favorite exercise with a friend. RECOMMENDATIONS FOR EXERCISING WITH TYPE 1 OR TYPE 2 DIABETES   Check your blood glucose before exercising. If blood glucose levels are greater than 240 mg/dL, check for urine ketones. Do not exercise if ketones are present.  Avoid injecting insulin into areas of the body that are going to be exercised. For example, avoid injecting insulin into:  The arms when playing tennis.  The legs when jogging.  Keep a record of:  Food   intake before and after you exercise.  Expected peak times of insulin action.  Blood glucose levels before and after you exercise.  The type and amount of exercise you have done.  Review your records with your health care provider. Your health care provider will help you to develop guidelines for adjusting food intake and insulin amounts before and after exercising.  If you take insulin or oral hypoglycemic  agents, watch for signs and symptoms of hypoglycemia. They include:  Dizziness.  Shaking.  Sweating.  Chills.  Confusion.  Drink plenty of water while you exercise to prevent dehydration or heat stroke. Body water is lost during exercise and must be replaced.  Talk to your health care provider before starting an exercise program to make sure it is safe for you. Remember, almost any type of activity is better than none. Document Released: 09/26/2003 Document Revised: 11/20/2013 Document Reviewed: 12/13/2012 ExitCare Patient Information 2015 [employer]. This information is not intended to replace advice given to you by your health care provider. Make sure you discuss any questions you have with your health care provider.  

## 2014-05-15 ENCOUNTER — Ambulatory Visit: Payer: Self-pay | Attending: Internal Medicine

## 2014-05-15 LAB — MICROALBUMIN, URINE: Microalb, Ur: 1.4 mg/dL (ref <2.0)

## 2014-05-15 LAB — VITAMIN D 25 HYDROXY (VIT D DEFICIENCY, FRACTURES): VIT D 25 HYDROXY: 59 ng/mL (ref 30–89)

## 2014-05-18 ENCOUNTER — Other Ambulatory Visit: Payer: Self-pay | Admitting: Internal Medicine

## 2014-05-18 DIAGNOSIS — E079 Disorder of thyroid, unspecified: Secondary | ICD-10-CM

## 2014-05-18 MED ORDER — LEVOTHYROXINE SODIUM 150 MCG PO TABS: 150.0000 ug | ORAL_TABLET | Freq: Every day | ORAL | Status: AC

## 2014-05-21 LAB — HM DIABETES EYE EXAM

## 2014-05-29 ENCOUNTER — Other Ambulatory Visit: Payer: Self-pay

## 2014-06-17 ENCOUNTER — Encounter (HOSPITAL_COMMUNITY): Payer: Self-pay | Admitting: *Deleted

## 2014-06-17 ENCOUNTER — Emergency Department (INDEPENDENT_AMBULATORY_CARE_PROVIDER_SITE_OTHER)
Admission: EM | Admit: 2014-06-17 | Discharge: 2014-06-17 | Disposition: A | Discharge status: Home / Self Care | Payer: Self-pay | Source: Home / Self Care | Attending: Family Medicine | Admitting: Family Medicine

## 2014-06-17 ENCOUNTER — Ambulatory Visit (HOSPITAL_COMMUNITY): Payer: Self-pay | Attending: Emergency Medicine

## 2014-06-17 DIAGNOSIS — I10 Essential (primary) hypertension: Secondary | ICD-10-CM | POA: Insufficient documentation

## 2014-06-17 DIAGNOSIS — R05 Cough: Secondary | ICD-10-CM | POA: Insufficient documentation

## 2014-06-17 DIAGNOSIS — J40 Bronchitis, not specified as acute or chronic: Secondary | ICD-10-CM

## 2014-06-17 HISTORY — DX: Pure hypercholesterolemia, unspecified: E78.00

## 2014-06-17 MED ORDER — ALBUTEROL SULFATE HFA 108 (90 BASE) MCG/ACT IN AERS: 2.0000 | INHALATION_SPRAY | RESPIRATORY_TRACT | Status: AC | PRN

## 2014-06-17 MED ORDER — IPRATROPIUM-ALBUTEROL 0.5-2.5 (3) MG/3ML IN SOLN
3.0000 mL | Freq: Once | RESPIRATORY_TRACT | Status: AC
  Administered 2014-06-17: 3 mL via RESPIRATORY_TRACT

## 2014-06-17 MED ORDER — AEROCHAMBER PLUS W/MASK MISC
Status: AC
  Filled 2014-06-17: qty 1

## 2014-06-17 MED ORDER — IPRATROPIUM-ALBUTEROL 0.5-2.5 (3) MG/3ML IN SOLN
RESPIRATORY_TRACT | Status: AC
  Filled 2014-06-17: qty 3

## 2014-06-17 NOTE — ED Notes (Signed)
Pt to XR dept via shuttle.

## 2014-06-17 NOTE — ED Notes (Signed)
Breathing treatment in progress

## 2014-06-17 NOTE — Discharge Instructions (Signed)
Use the inhaler with the spacer every 4 hours until you are feeling better. Follow up with your doctor to have your lungs rechecked.    How to Use an Inhaler Using your inhaler correctly is very important. Good technique will make sure that the medicine reaches your lungs.  HOW TO USE AN INHALER: 1. Take the cap off the inhaler. 2. If this is the first time using your inhaler, you need to prime it. Shake the inhaler for 5 seconds. Release four puffs into the air, away from your face. Ask your doctor for help if you have questions. 3. Shake the inhaler for 5 seconds. 4. Turn the inhaler so the bottle is above the mouthpiece. 5. Put your pointer finger on top of the bottle. Your thumb holds the bottom of the inhaler. 6. Open your mouth. 7. Either hold the inhaler away from your mouth (the width of 2 fingers) or place your lips tightly around the mouthpiece. Ask your doctor which way to use your inhaler. 8. Breathe out as much air as possible. 9. Breathe in and push down on the bottle 1 time to release the medicine. You will feel the medicine go in your mouth and throat. 10. Continue to take a deep breath in very slowly. Try to fill your lungs. 11. After you have breathed in completely, hold your breath for 10 seconds. This will help the medicine to settle in your lungs. If you cannot hold your breath for 10 seconds, hold it for as long as you can before you breathe out. 12. Breathe out slowly, through pursed lips. Whistling is an example of pursed lips. 13. If your doctor has told you to take more than 1 puff, wait at least 15-30 seconds between puffs. This will help you get the best results from your medicine. Do not use the inhaler more than your doctor tells you to. 14. Put the cap back on the inhaler. 15. Follow the directions from your doctor or from the inhaler package about cleaning the inhaler. If you use more than one inhaler, ask your doctor which inhalers to use and what order to use  them in. Ask your doctor to help you figure out when you will need to refill your inhaler.  If you use a steroid inhaler, always rinse your mouth with water after your last puff, gargle and spit out the water. Do not swallow the water. GET HELP IF:  The inhaler medicine only partially helps to stop wheezing or shortness of breath.  You are having trouble using your inhaler.  You have some increase in thick spit (phlegm). GET HELP RIGHT AWAY IF:  The inhaler medicine does not help your wheezing or shortness of breath or you have tightness in your chest.  You have dizziness, headaches, or fast heart rate.  You have chills, fever, or night sweats.  You have a large increase of thick spit, or your thick spit is bloody. MAKE SURE YOU:   Understand these instructions.  Will watch your condition.  Will get help right away if you are not doing well or get worse. Document Released: 04/14/2008 Document Revised: 04/26/2013 Document Reviewed: 02/02/2013 [facility] Patient Information 2015 [place], [place]. This information is not intended to replace advice given to you by your health care provider. Make sure you discuss any questions you have with your health care provider.

## 2014-06-17 NOTE — ED Provider Notes (Signed)
CSN: [id number]     Arrival date & time 06/17/14  1346 History   First MD Initiated Contact with Patient 06/17/14 1531     Chief Complaint  Patient presents with  . Cough   (Consider location/radiation/quality/duration/timing/severity/associated sxs/prior Treatment) Patient is a 58 y.o. female presenting with cough. The history is provided by the patient and a relative. A language interpreter was used (Pt speaks excellent English. Son helped occasionally with a few words. ).  Cough Cough characteristics:  Non-productive Onset quality:  Gradual Duration:  2 days Timing:  Intermittent Progression:  Unchanged Chronicity:  New Context: not upper respiratory infection   Relieved by:  None tried Worsened by:  Lying down Ineffective treatments:  None tried Associated symptoms: wheezing   Associated symptoms: no chest pain, no chills, no ear pain, no fever, no rhinorrhea, no shortness of breath and no sore throat     Past Medical History  Diagnosis Date  . Hypertension   . Diabetes mellitus   . Thyroid disease   . Hypercholesteremia    Past Surgical History  Procedure Laterality Date  . Cholecystectomy    . Thyroidectomy     Family History  Problem Relation Age of Onset  . Thyroid disease Mother    History  Substance Use Topics  . Smoking status: Never Smoker   . Smokeless tobacco: Not on file  . Alcohol Use: No   OB History    No data available     Review of Systems  Constitutional: Negative for fever and chills.  HENT: Negative for congestion, ear pain, rhinorrhea and sore throat.   Respiratory: Positive for cough and wheezing. Negative for shortness of breath.   Cardiovascular: Negative for chest pain.    Allergies  Review of patient's allergies indicates no known allergies.  Home Medications   Prior to Admission medications   Medication Sig Start Date End Date Taking? Authorizing Provider  aspirin 81 MG tablet Take 1 tablet (81 mg total) by mouth daily.  06/12/13  Yes Lorayne Marek, MD  Calcium Carbonate-Vitamin D (CALCIUM 500+D PO) Take by mouth.   Yes Historical Provider, MD  Cholecalciferol (VITAMIN D-3 PO) Take by mouth.   Yes Historical Provider, MD  cyclobenzaprine (FLEXERIL) 10 MG tablet Take 1 tablet (10 mg total) by mouth 3 (three) times daily as needed for muscle spasms. 11/20/13  Yes Olugbemiga E Jegede, MD  Flaxseed, Linseed, (FLAX SEED OIL PO) Take 1,000 mg by mouth daily.     Yes Historical Provider, MD  gabapentin (NEURONTIN) 300 MG capsule Take 1 capsule (300 mg total) by mouth 3 (three) times daily. 05/14/14  Yes Tresa Garter, MD  Glucosamine-Chondroit-Vit C-Mn (GLUCOSAMINE CHONDROITIN COMPLX) CAPS Take 1 capsule by mouth daily.     Yes Historical Provider, MD  glucose blood test strip Use as instructed 11/20/13  Yes Tresa Garter, MD  hydrochlorothiazide (HYDRODIURIL) 25 MG tablet Take 1 tablet (25 mg total) by mouth daily. 03/22/14  Yes Lance Bosch, NP  levothyroxine (SYNTHROID, LEVOTHROID) 150 MCG tablet Take 1 tablet (150 mcg total) by mouth daily. 05/18/14  Yes Tresa Garter, MD  losartan (COZAAR) 50 MG tablet Take 1 tablet (50 mg total) by mouth daily. 03/22/14  Yes Lance Bosch, NP  metFORMIN (GLUCOPHAGE) 500 MG tablet Take 1 tablet (500 mg total) by mouth daily with breakfast. 03/22/14  Yes Lance Bosch, NP  omeprazole (PRILOSEC) 20 MG capsule Take 1 capsule (20 mg total) by mouth daily. 11/20/13  Yes  Tresa Garter, MD  simvastatin (ZOCOR) 20 MG tablet Take 1 tablet (20 mg total) by mouth daily. 11/20/13  Yes Tresa Garter, MD  traMADol (ULTRAM) 50 MG tablet Take 1 tablet (50 mg total) by mouth every 6 (six) hours as needed. 11/20/13  Yes Tresa Garter, MD  TRUEPLUS LANCETS 28G MISC Use to test blood sugars daily as directed by physician 07/18/13  Yes Tresa Garter, MD  albuterol (PROVENTIL HFA;VENTOLIN HFA) 108 (90 BASE) MCG/ACT inhaler Inhale 2 puffs into the lungs every 4 (four) hours as  needed for wheezing or shortness of breath. 06/17/14   Carvel Getting, NP  bisacodyl (DULCOLAX) 5 MG EC tablet Take 5 mg by mouth 2 (two) times daily as needed. For constipation     Historical Provider, MD  Blood Glucose Monitoring Suppl (BLOOD GLUCOSE METER) kit Use as instructed 07/18/13   Tresa Garter, MD  cholestyramine light (PREVALITE) 4 G packet Take 4 g by mouth 2 (two) times daily.    Historical Provider, MD  colesevelam (WELCHOL) 625 MG tablet Take 1,250 mg by mouth 3 (three) times daily.      Historical Provider, MD  diclofenac (VOLTAREN) 25 MG EC tablet Take 25 mg by mouth 2 (two) times daily.    Historical Provider, MD  diclofenac sodium (VOLTAREN) 1 % GEL Apply 2 g topically 4 (four) times daily. 11/20/13   Tresa Garter, MD   BP 177/94 mmHg  Pulse 88  Temp(Src) 97.7 F (36.5 C) (Oral)  Resp 16  SpO2 95% Physical Exam  Constitutional: She appears well-developed and well-nourished. No distress.  Cardiovascular: Normal rate and regular rhythm.   Pulmonary/Chest: Effort normal. No respiratory distress. She has wheezes in the right lower field. She has rhonchi in the right lower field.  Occasional wet sounding cough    ED Course  Procedures (including critical care time) Labs Review Labs Reviewed - No data to display  Imaging Review Dg Chest 2 View  06/17/2014   CLINICAL DATA:  58 year old with productive cough and hypertension.  EXAM: CHEST  2 VIEW  COMPARISON:  07/14/2013  FINDINGS: The lung markings are prominent but probably related to technique. There is no evidence for airspace disease or edema. Heart and mediastinum are within normal limits. Trachea is midline. Negative for pleural effusions. No acute bone abnormality. Surgical clips in the right upper abdomen.  IMPRESSION: No active cardiopulmonary disease.   Electronically Signed   By: Markus Daft M.D.   On: 06/17/2014 14:53   Given albuterol/atrovent nebs. Pt reports sx improved and lung sounds improved.  Only has slight crackles in RLL now.   MDM   1. Bronchitis   rx albbuterol MDI. Given spacer to use. Follow up with pcp.   Elected not to tx with prednisone due to DM hx.   Carvel Getting, NP 06/17/14 [id number]

## 2014-06-17 NOTE — ED Notes (Signed)
Instructed how to use spacer with inhaler.  Both pt & family member verbalized understanding.

## 2014-06-17 NOTE — ED Notes (Signed)
C/O productive cough and rattling noise in chest x 2-3 nights.  Denies fevers; only occasional runny nose.  Denies any pain or SOB.

## 2014-06-17 NOTE — ED Notes (Signed)
Returned from [facility] [facility].  Updated on wait for report.

## 2014-06-22 ENCOUNTER — Other Ambulatory Visit: Payer: Self-pay | Admitting: *Deleted

## 2014-06-22 ENCOUNTER — Telehealth: Payer: Self-pay | Admitting: Internal Medicine

## 2014-06-22 DIAGNOSIS — E119 Type 2 diabetes mellitus without complications: Secondary | ICD-10-CM

## 2014-06-22 MED ORDER — METFORMIN HCL 500 MG PO TABS: 500.0000 mg | ORAL_TABLET | Freq: Every day | ORAL | Status: DC

## 2014-06-22 MED ORDER — METFORMIN HCL 500 MG PO TABS: 500.0000 mg | ORAL_TABLET | Freq: Two times a day (BID) | ORAL | Status: DC

## 2014-06-22 NOTE — Progress Notes (Signed)
Pt came in today stating that she was taking metformin 2 times a day and the rx was written for 1 time a day. After speaking with Dr. Doreene Burke he said that she should be taking the rx twice a day. I rewrote the medication with the correct sig and sent it to our pharmacy.

## 2014-06-22 NOTE — Telephone Encounter (Signed)
Pt.came into facility stating that she has been taking Metformin twice a day instead of once a day and is now out of medication. Please f/u with pt.

## 2014-06-28 ENCOUNTER — Telehealth: Payer: Self-pay

## 2014-06-28 NOTE — Telephone Encounter (Signed)
Pt was referred for colonoscopy and her husband called for her. Pt did tell him that she has constipation and she is scheduled and OV with Walden Field, NP on 08/01/2014 at 2:30 PM. Appt letter has been mailed to them.

## 2014-07-09 ENCOUNTER — Encounter: Payer: Self-pay | Admitting: Internal Medicine

## 2014-07-10 ENCOUNTER — Other Ambulatory Visit: Payer: Self-pay | Admitting: Internal Medicine

## 2014-07-16 ENCOUNTER — Ambulatory Visit: Payer: Self-pay | Attending: Internal Medicine

## 2014-08-01 ENCOUNTER — Telehealth: Payer: Self-pay | Admitting: Nurse Practitioner

## 2014-08-01 ENCOUNTER — Encounter: Payer: Self-pay | Admitting: Nurse Practitioner

## 2014-08-01 ENCOUNTER — Ambulatory Visit (INDEPENDENT_AMBULATORY_CARE_PROVIDER_SITE_OTHER): Payer: Self-pay | Admitting: Nurse Practitioner

## 2014-08-01 ENCOUNTER — Other Ambulatory Visit: Payer: Self-pay

## 2014-08-01 VITALS — BP 195/91 | HR 105 | Temp 97.4°F | Ht 63.0 in | Wt 220.2 lb

## 2014-08-01 DIAGNOSIS — Z1211 Encounter for screening for malignant neoplasm of colon: Secondary | ICD-10-CM

## 2014-08-01 DIAGNOSIS — Z86010 Personal history of colonic polyps: Secondary | ICD-10-CM | POA: Insufficient documentation

## 2014-08-01 MED ORDER — PEG-KCL-NACL-NASULF-NA ASC-C 100 G PO SOLR: 1.0000 | Freq: Once | ORAL | Status: AC

## 2014-08-01 NOTE — Progress Notes (Signed)
Primary Care Physician:  Angelica Chessman, MD Primary Gastroenterologist:  Dr. Gala Romney  Chief Complaint  Patient presents with  . Colonoscopy    HPI:   59 year old female presents for screening colonoscopy. Last colonoscopy on 10/27/11 with Dr. Benson Norway at [facility] endoscopy center. Found a 70m sessile cecal poly and two 369msessile descending colon polyps removed and sent for biopsy, otherwise normal colon. [facility]. Recommended 3-5 year follow-up.   Denies abdominal pain, hematochezia, and melena. Has a bowel movement about once a day and is typically normal formed with occasional constipation which is adequately resolved with a stool softener. Denies dysphagia, N/V, GERD symptoms. Denies any other upper or lower GI symptoms.   Past Medical History  Diagnosis Date  . Hypertension   . Diabetes mellitus   . Thyroid disease   . Hypercholesteremia     Past Surgical History  Procedure Laterality Date  . Cholecystectomy    . Thyroidectomy    . Colonoscopy  2013    polypectomy x 3; repeat 3-5 years.    Current Outpatient Prescriptions  Medication Sig Dispense Refill  . albuterol (PROVENTIL HFA;VENTOLIN HFA) 108 (90 BASE) MCG/ACT inhaler Inhale 2 puffs into the lungs every 4 (four) hours as needed for wheezing or shortness of breath. 1 Inhaler 0  . aspirin 81 MG tablet Take 1 tablet (81 mg total) by mouth daily. 30 tablet 3  . bisacodyl (DULCOLAX) 5 MG EC tablet Take 5 mg by mouth 2 (two) times daily as needed. For constipation     . Blood Glucose Monitoring Suppl (BLOOD GLUCOSE METER) kit Use as instructed 1 each 0  . Calcium Carbonate-Vitamin D (CALCIUM 500+D PO) Take by mouth.    . Cholecalciferol (VITAMIN D-3 PO) Take by mouth.    . cyclobenzaprine (FLEXERIL) 10 MG tablet Take 1 tablet (10 mg total) by mouth 3 (three) times daily as needed for muscle spasms. 60 tablet 2  . Flaxseed, Linseed, (FLAX SEED OIL PO) Take 1,000 mg by mouth daily.      . Marland Kitchengabapentin (NEURONTIN) 300 MG capsule Take 1 capsule (300 mg total) by mouth 3 (three) times daily. 180 capsule 3  . Glucosamine-Chondroit-Vit C-Mn (GLUCOSAMINE CHONDROITIN COMPLX) CAPS Take 1 capsule by mouth daily.      . Marland Kitchenlucose blood test strip Use as instructed 100 each 12  . hydrochlorothiazide (HYDRODIURIL) 25 MG tablet Take 1 tablet (25 mg total) by mouth daily. 30 tablet 3  . levothyroxine (SYNTHROID, LEVOTHROID) 150 MCG tablet Take 1 tablet (150 mcg total) by mouth daily. 90 tablet 3  . losartan (COZAAR) 50 MG tablet Take 1 tablet (50 mg total) by mouth daily. 30 tablet 3  . metFORMIN (GLUCOPHAGE) 500 MG tablet Take 1 tablet (500 mg total) by mouth 2 (two) times daily with a meal. 180 tablet 3  . omeprazole (PRILOSEC) 20 MG capsule Take 1 capsule (20 mg total) by mouth daily. 90 capsule 3  . simvastatin (ZOCOR) 20 MG tablet Take 1 tablet (20 mg total) by mouth daily. 90 tablet 3   No current facility-administered medications for this visit.    Allergies as of 08/01/2014  . (No Known Allergies)    Family History  Problem Relation Age of Onset  . Thyroid disease Mother     History   Social History  . Marital Status: Married    Spouse Name: N/A    Number of Children: N/A  . Years of Education: N/A  Occupational History  . Not on file.   Social History Main Topics  . Smoking status: Never Smoker   . Smokeless tobacco: Not on file  . Alcohol Use: No  . Drug Use: No  . Sexual Activity: Not on file   Other Topics Concern  . Not on file   Social History Narrative    Review of Systems: Gen: Denies any fever, chills, fatigue, weight loss, lack of appetite.  CV: Denies chest pain, heart palpitations, peripheral edema, syncope.  Resp: Denies shortness of breath at rest or with exertion. Denies wheezing or cough.  GI: See HPI. Denies dysphagia or odynophagia. Denies jaundice, hematemesis, fecal incontinence. GU : Denies urinary burning, urinary frequency, urinary  hesitancy MS: Denies joint pain, muscle weakness, cramps, or limitation of movement.  Derm: Denies rash, itching, dry skin Psych: Denies depression, anxiety, memory loss, and confusion Heme: Denies bruising, bleeding, and enlarged lymph nodes.  Physical Exam: BP 195/91 mmHg  Pulse 105  Temp(Src) 97.4 F (36.3 C) (Oral)  Ht 5' 3" (1.6 m)  Wt 220 lb 3.2 oz (99.882 kg)  BMI 39.02 kg/m2 General:   Alert and oriented. Pleasant and cooperative. Well-nourished and well-developed.  Head:  Normocephalic and atraumatic. Eyes:  Without icterus, sclera clear and conjunctiva pink.  Ears:  Normal auditory acuity. Nose:  No deformity, discharge,  or lesions. Mouth:  No deformity or lesions, oral mucosa pink.  Neck:  Supple, without mass or thyromegaly. Lungs:  Clear to auscultation bilaterally. No wheezes, rales, or rhonchi. No distress.  Heart:  S1, S2 present without murmurs appreciated.  Abdomen:  +BS, soft, non-tender and non-distended. No HSM noted. No guarding or rebound. No masses appreciated.  Rectal:  Deferred  Msk:  Symmetrical without gross deformities. Normal posture. Pulses:  Normal pulses noted. Extremities:  Without clubbing or edema. Neurologic:  Alert and  oriented x4;  grossly normal neurologically. Skin:  Intact without significant lesions or rashes. Cervical Nodes:  No significant cervical adenopathy. Psych:  Alert and cooperative. Normal mood and affect.     08/01/2014 3:20 PM  

## 2014-08-01 NOTE — Assessment & Plan Note (Addendum)
History of colon polyps x 3 in 2013 with recommended 3-5 year followup. No pathology report is available in [facility] system, but I have requested those records. No symptoms of overt bleeding, change in bowel habits, or other red flag/warning signs/symptoms. Adequate sedation with her previous colonoscopy. On ASA 81mg  daily, no other anticoagulants. Half DM meds the night before the procedure and none the morning of.  Proceed with TCS with Dr. Gala Romney in near future: the risks, benefits, and alternatives have been discussed with the patient in detail. The patient states understanding and desires to proceed.

## 2014-08-01 NOTE — Patient Instructions (Signed)
1. We will schedule your colonoscopy for you. 2. Further recommendations to follow based on the results of the procedure.

## 2014-08-01 NOTE — Telephone Encounter (Signed)
This patient had a colonoscopy in 2013 with polypectomy x 3 at [facility] I don't see the pathology report, can we try and request that please? Thanks.

## 2014-08-02 NOTE — Telephone Encounter (Signed)
I sent a request on letter head to get the Path report. I may need a signed release of records before they will send it to me. Please let me know when the patients are still in office if I need to get outside records so I can get release signed before they leave. Thanks

## 2014-08-08 ENCOUNTER — Encounter (HOSPITAL_COMMUNITY): Payer: Self-pay | Admitting: *Deleted

## 2014-08-08 ENCOUNTER — Encounter (HOSPITAL_COMMUNITY)
Admission: RE | Disposition: A | Discharge status: Home / Self Care | Payer: Self-pay | Source: Ambulatory Visit | Attending: Internal Medicine

## 2014-08-08 ENCOUNTER — Ambulatory Visit (HOSPITAL_COMMUNITY)
Admission: RE | Admit: 2014-08-08 | Discharge: 2014-08-08 | Disposition: A | Discharge status: Home / Self Care | Payer: Self-pay | Source: Ambulatory Visit | Attending: Internal Medicine | Admitting: Internal Medicine

## 2014-08-08 DIAGNOSIS — D124 Benign neoplasm of descending colon: Secondary | ICD-10-CM | POA: Insufficient documentation

## 2014-08-08 DIAGNOSIS — K5730 Diverticulosis of large intestine without perforation or abscess without bleeding: Secondary | ICD-10-CM | POA: Insufficient documentation

## 2014-08-08 DIAGNOSIS — I10 Essential (primary) hypertension: Secondary | ICD-10-CM | POA: Insufficient documentation

## 2014-08-08 DIAGNOSIS — E079 Disorder of thyroid, unspecified: Secondary | ICD-10-CM | POA: Insufficient documentation

## 2014-08-08 DIAGNOSIS — Z860100 Personal history of colon polyps, unspecified: Secondary | ICD-10-CM | POA: Insufficient documentation

## 2014-08-08 DIAGNOSIS — Z7982 Long term (current) use of aspirin: Secondary | ICD-10-CM | POA: Insufficient documentation

## 2014-08-08 DIAGNOSIS — E119 Type 2 diabetes mellitus without complications: Secondary | ICD-10-CM | POA: Insufficient documentation

## 2014-08-08 DIAGNOSIS — E780 Pure hypercholesterolemia: Secondary | ICD-10-CM | POA: Insufficient documentation

## 2014-08-08 DIAGNOSIS — Z1211 Encounter for screening for malignant neoplasm of colon: Secondary | ICD-10-CM | POA: Insufficient documentation

## 2014-08-08 HISTORY — PX: COLONOSCOPY: SHX5424

## 2014-08-08 LAB — GLUCOSE, CAPILLARY: GLUCOSE-CAPILLARY: 141 mg/dL — AB (ref 70–99)

## 2014-08-08 SURGERY — COLONOSCOPY
Anesthesia: Moderate Sedation

## 2014-08-08 MED ORDER — STERILE WATER FOR IRRIGATION IR SOLN
Status: DC | PRN
  Administered 2014-08-08: 09:00:00

## 2014-08-08 MED ORDER — SODIUM CHLORIDE 0.9 % IV SOLN
INTRAVENOUS | Status: DC
  Administered 2014-08-08: 1000 mL via INTRAVENOUS

## 2014-08-08 MED ORDER — ONDANSETRON HCL 4 MG/2ML IJ SOLN
INTRAMUSCULAR | Status: DC | PRN
  Administered 2014-08-08: 4 mg via INTRAVENOUS

## 2014-08-08 MED ORDER — ONDANSETRON HCL 4 MG/2ML IJ SOLN
INTRAMUSCULAR | Status: AC
  Filled 2014-08-08: qty 2

## 2014-08-08 MED ORDER — MEPERIDINE HCL 100 MG/ML IJ SOLN
INTRAMUSCULAR | Status: DC | PRN
  Administered 2014-08-08 (×3): 25 mg via INTRAVENOUS
  Administered 2014-08-08: 50 mg via INTRAVENOUS

## 2014-08-08 MED ORDER — MIDAZOLAM HCL 5 MG/5ML IJ SOLN
INTRAMUSCULAR | Status: DC | PRN
  Administered 2014-08-08: 2 mg via INTRAVENOUS
  Administered 2014-08-08 (×3): 1 mg via INTRAVENOUS
  Administered 2014-08-08: 2 mg via INTRAVENOUS

## 2014-08-08 MED ORDER — MEPERIDINE HCL 100 MG/ML IJ SOLN
INTRAMUSCULAR | Status: AC
  Filled 2014-08-08: qty 2

## 2014-08-08 MED ORDER — MIDAZOLAM HCL 5 MG/5ML IJ SOLN
INTRAMUSCULAR | Status: AC
  Filled 2014-08-08: qty 10

## 2014-08-08 NOTE — Op Note (Signed)
[facility] [address] [place], [address]   COLONOSCOPY PROCEDURE REPORT  PATIENT: Sherolyn, Trettin  MR#: [id number] BIRTHDATE: [birth year] , 66  yrs. old GENDER: female ENDOSCOPIST: R.  Garfield Cornea, MD FACP [facility] REFERRED NO:MVEHMCNOBS Doreene Burke, M.D. PROCEDURE DATE:  08/13/2014 PROCEDURE:   Colonoscopy with snare polypectomy INDICATIONS:  Surveillance examination; history of multiple colonic polyps removed previously. MEDICATIONS: Versed 4 mg IV and Demerol 75 mg IV in divided doses. Zofran 4 mg IV. ASA CLASS:       Class II  CONSENT: The risks, benefits, alternatives and imponderables including but not limited to bleeding, perforation as well as the possibility of a missed lesion have been reviewed.  The potential for biopsy, lesion removal, etc. have also been discussed. Questions have been answered.  All parties agreeable.  Please see the history and physical in the medical record for more information.  DESCRIPTION OF PROCEDURE:   After the risks benefits and alternatives of the procedure were thoroughly explained, informed consent was obtained.  The digital rectal exam revealed no rectal mass.   The EC-[id number] ([id number])  endoscope was introduced through the anus and advanced to the cecum, which was identified by both the appendix and ileocecal valve. No adverse events experienced. The quality of the prep was adequate.  The instrument was then slowly withdrawn as the colon was fully examined.      COLON FINDINGS: Normal rectum.  Scattered pancolonic diverticula (right sided greater than left).  (1) 4 mm pedunculated polyp in the mid descending segment; otherwise, the remainder of the colonic mucosa appeared normal.  The above mentioned polyp was cold snare removed and recovered for the pathologist.  Retroflexed views revealed no abnormalities. .  Withdrawal time=16 minutes 0 seconds.  The scope was withdrawn and the procedure completed. COMPLICATIONS:  There were no immediate complications.  ENDOSCOPIC IMPRESSION: Colonic diverticulosis. Single colonic polyp?"removed as described above.  RECOMMENDATIONS: Follow-up on pathology.  eSigned:  R. Garfield Cornea, MD Rosalita Chessman [facility] Aug 13, 2014 9:45 AM   cc:  CPT CODES: ICD CODES:  The ICD and CPT codes recommended by this software are interpretations from the data that the clinical staff has captured with the software.  The verification of the translation of this report to the ICD and CPT codes and modifiers is the sole responsibility of the health care institution and practicing physician where this report was generated.  [employer] will not be held responsible for the validity of the ICD and CPT codes included on this report.  AMA assumes no liability for data contained or not contained herein. CPT is a Designer, television/film set of the [employer].  PATIENT NAME:  Ellanie, Oppedisano MR#: [id number]

## 2014-08-08 NOTE — H&P (View-Only) (Signed)
Primary Care Physician:  Angelica Chessman, MD Primary Gastroenterologist:  Dr. Gala Romney  Chief Complaint  Patient presents with  . Colonoscopy    HPI:   59 year old female presents for screening colonoscopy. Last colonoscopy on 10/27/11 with Dr. Benson Norway at [facility] endoscopy center. Found a 21m sessile cecal poly and two 352msessile descending colon polyps removed and sent for biopsy, otherwise normal colon. [facility] Recommended 3-5 year follow-up.   Denies abdominal pain, hematochezia, and melena. Has a bowel movement about once a day and is typically normal formed with occasional constipation which is adequately resolved with a stool softener. Denies dysphagia, N/V, GERD symptoms. Denies any other upper or lower GI symptoms.   Past Medical History  Diagnosis Date  . Hypertension   . Diabetes mellitus   . Thyroid disease   . Hypercholesteremia     Past Surgical History  Procedure Laterality Date  . Cholecystectomy    . Thyroidectomy    . Colonoscopy  2013    polypectomy x 3; repeat 3-5 years.    Current Outpatient Prescriptions  Medication Sig Dispense Refill  . albuterol (PROVENTIL HFA;VENTOLIN HFA) 108 (90 BASE) MCG/ACT inhaler Inhale 2 puffs into the lungs every 4 (four) hours as needed for wheezing or shortness of breath. 1 Inhaler 0  . aspirin 81 MG tablet Take 1 tablet (81 mg total) by mouth daily. 30 tablet 3  . bisacodyl (DULCOLAX) 5 MG EC tablet Take 5 mg by mouth 2 (two) times daily as needed. For constipation     . Blood Glucose Monitoring Suppl (BLOOD GLUCOSE METER) kit Use as instructed 1 each 0  . Calcium Carbonate-Vitamin D (CALCIUM 500+D PO) Take by mouth.    . Cholecalciferol (VITAMIN D-3 PO) Take by mouth.    . cyclobenzaprine (FLEXERIL) 10 MG tablet Take 1 tablet (10 mg total) by mouth 3 (three) times daily as needed for muscle spasms. 60 tablet 2  . Flaxseed, Linseed, (FLAX SEED OIL PO) Take 1,000 mg by mouth daily.      . Marland Kitchengabapentin (NEURONTIN) 300 MG capsule Take 1 capsule (300 mg total) by mouth 3 (three) times daily. 180 capsule 3  . Glucosamine-Chondroit-Vit C-Mn (GLUCOSAMINE CHONDROITIN COMPLX) CAPS Take 1 capsule by mouth daily.      . Marland Kitchenlucose blood test strip Use as instructed 100 each 12  . hydrochlorothiazide (HYDRODIURIL) 25 MG tablet Take 1 tablet (25 mg total) by mouth daily. 30 tablet 3  . levothyroxine (SYNTHROID, LEVOTHROID) 150 MCG tablet Take 1 tablet (150 mcg total) by mouth daily. 90 tablet 3  . losartan (COZAAR) 50 MG tablet Take 1 tablet (50 mg total) by mouth daily. 30 tablet 3  . metFORMIN (GLUCOPHAGE) 500 MG tablet Take 1 tablet (500 mg total) by mouth 2 (two) times daily with a meal. 180 tablet 3  . omeprazole (PRILOSEC) 20 MG capsule Take 1 capsule (20 mg total) by mouth daily. 90 capsule 3  . simvastatin (ZOCOR) 20 MG tablet Take 1 tablet (20 mg total) by mouth daily. 90 tablet 3   No current facility-administered medications for this visit.    Allergies as of 08/01/2014  . (No Known Allergies)    Family History  Problem Relation Age of Onset  . Thyroid disease Mother     History   Social History  . Marital Status: Married    Spouse Name: N/A    Number of Children: N/A  . Years of Education: N/A  Occupational History  . Not on file.   Social History Main Topics  . Smoking status: Never Smoker   . Smokeless tobacco: Not on file  . Alcohol Use: No  . Drug Use: No  . Sexual Activity: Not on file   Other Topics Concern  . Not on file   Social History Narrative    Review of Systems: Gen: Denies any fever, chills, fatigue, weight loss, lack of appetite.  CV: Denies chest pain, heart palpitations, peripheral edema, syncope.  Resp: Denies shortness of breath at rest or with exertion. Denies wheezing or cough.  GI: See HPI. Denies dysphagia or odynophagia. Denies jaundice, hematemesis, fecal incontinence. GU : Denies urinary burning, urinary frequency, urinary  hesitancy MS: Denies joint pain, muscle weakness, cramps, or limitation of movement.  Derm: Denies rash, itching, dry skin Psych: Denies depression, anxiety, memory loss, and confusion Heme: Denies bruising, bleeding, and enlarged lymph nodes.  Physical Exam: BP 195/91 mmHg  Pulse 105  Temp(Src) 97.4 F (36.3 C) (Oral)  Ht 5' 3" (1.6 m)  Wt 220 lb 3.2 oz (99.882 kg)  BMI 39.02 kg/m2 General:   Alert and oriented. Pleasant and cooperative. Well-nourished and well-developed.  Head:  Normocephalic and atraumatic. Eyes:  Without icterus, sclera clear and conjunctiva pink.  Ears:  Normal auditory acuity. Nose:  No deformity, discharge,  or lesions. Mouth:  No deformity or lesions, oral mucosa pink.  Neck:  Supple, without mass or thyromegaly. Lungs:  Clear to auscultation bilaterally. No wheezes, rales, or rhonchi. No distress.  Heart:  S1, S2 present without murmurs appreciated.  Abdomen:  +BS, soft, non-tender and non-distended. No HSM noted. No guarding or rebound. No masses appreciated.  Rectal:  Deferred  Msk:  Symmetrical without gross deformities. Normal posture. Pulses:  Normal pulses noted. Extremities:  Without clubbing or edema. Neurologic:  Alert and  oriented x4;  grossly normal neurologically. Skin:  Intact without significant lesions or rashes. Cervical Nodes:  No significant cervical adenopathy. Psych:  Alert and cooperative. Normal mood and affect.     08/01/2014 3:20 PM  

## 2014-08-08 NOTE — Progress Notes (Signed)
cc'ed to pcp °

## 2014-08-08 NOTE — Interval H&P Note (Signed)
History and Physical Interval Note:  08/08/2014 8:43 AM  Katie Rhodes  has presented today for surgery, with the diagnosis of screening colonoscopy  The various methods of treatment have been discussed with the patient and family. After consideration of risks, benefits and other options for treatment, the patient has consented to  Procedure(s) with comments: COLONOSCOPY (N/A) - 830am as a surgical intervention .  The patient's history has been reviewed, patient examined, no change in status, stable for surgery.  I have reviewed the patient's chart and labs.  Questions were answered to the patient's satisfaction.     Katie Rhodes  No change. Colonoscopy-surveillance per plan. The risks, benefits, limitations, alternatives and imponderables have been reviewed with the patient. Questions have been answered. All parties are agreeable.

## 2014-08-08 NOTE — Discharge Instructions (Addendum)
Colonoscopy Discharge Instructions  Read the instructions outlined below and refer to this sheet in the next few weeks. These discharge instructions provide you with general information on caring for yourself after you leave the hospital. Your doctor may also give you specific instructions. While your treatment has been planned according to the most current medical practices available, unavoidable complications occasionally occur. If you have any problems or questions after discharge, call Dr. Gala Romney at [phone]. ACTIVITY  You may resume your regular activity, but move at a slower pace for the next 24 hours.   Take frequent rest periods for the next 24 hours.   Walking will help get rid of the air and reduce the bloated feeling in your belly (abdomen).   No driving for 24 hours (because of the medicine (anesthesia) used during the test).    Do not sign any important legal documents or operate any machinery for 24 hours (because of the anesthesia used during the test).  NUTRITION  Drink plenty of fluids.   You may resume your normal diet as instructed by your doctor.   Begin with a light meal and progress to your normal diet. Heavy or fried foods are harder to digest and may make you feel sick to your stomach (nauseated).   Avoid alcoholic beverages for 24 hours or as instructed.  MEDICATIONS  You may resume your normal medications unless your doctor tells you otherwise.  WHAT YOU CAN EXPECT TODAY  Some feelings of bloating in the abdomen.   Passage of more gas than usual.   Spotting of blood in your stool or on the toilet paper.  IF YOU HAD POLYPS REMOVED DURING THE COLONOSCOPY:  No aspirin products for 7 days or as instructed.   No alcohol for 7 days or as instructed.   Eat a soft diet for the next 24 hours.  FINDING OUT THE RESULTS OF YOUR TEST Not all test results are available during your visit. If your test results are not back during the visit, make an appointment  with your caregiver to find out the results. Do not assume everything is normal if you have not heard from your caregiver or the medical facility. It is important for you to follow up on all of your test results.  SEEK IMMEDIATE MEDICAL ATTENTION IF:  You have more than a spotting of blood in your stool.   Your belly is swollen (abdominal distention).   You are nauseated or vomiting.   You have a temperature over 101.   You have abdominal pain or discomfort that is severe or gets worse throughout the day.     Polyp and diverticulosis information provided  Further recommendation  Diverticulosis Diverticulosis is the condition that develops when small pouches (diverticula) form in the wall of your colon. Your colon, or large intestine, is where water is absorbed and stool is formed. The pouches form when the inside layer of your colon pushes through weak spots in the outer layers of your colon. CAUSES  No one knows exactly what causes diverticulosis. RISK FACTORS  Being older than 8. Your risk for this condition increases with age. Diverticulosis is rare in people younger than 40 years. By age 59, almost everyone has it.  Eating a low-fiber diet.  Being frequently constipated.  Being overweight.  Not getting enough exercise.  Smoking.  Taking over-the-counter pain medicines, like aspirin and ibuprofen. SYMPTOMS  Most people with diverticulosis do not have symptoms. DIAGNOSIS  Because diverticulosis often has no symptoms, health  care providers often discover the condition during an exam for other colon problems. In many cases, a health care provider will diagnose diverticulosis while using a flexible scope to examine the colon (colonoscopy). TREATMENT  If you have never developed an infection related to diverticulosis, you may not need treatment. If you have had an infection before, treatment may include:  Eating more fruits, vegetables, and grains.  Taking a fiber  supplement.  Taking a live bacteria supplement (probiotic).  Taking medicine to relax your colon. HOME CARE INSTRUCTIONS   Drink at least 6-8 glasses of water each day to prevent constipation.  Try not to strain when you have a bowel movement.  Keep all follow-up appointments. If you have had an infection before:  Increase the fiber in your diet as directed by your health care provider or dietitian.  Take a dietary fiber supplement if your health care provider approves.  Only take medicines as directed by your health care provider. SEEK MEDICAL CARE IF:   You have abdominal pain.  You have bloating.  You have cramps.  You have not gone to the bathroom in 3 days. SEEK IMMEDIATE MEDICAL CARE IF:   Your pain gets worse.  Yourbloating becomes very bad.  You have a fever or chills, and your symptoms suddenly get worse.  You begin vomiting.  You have bowel movements that are bloody or black. MAKE SURE YOU:  Understand these instructions.  Will watch your condition.  Will get help right away if you are not doing well or get worse. Document Released: 04/02/2004 Document Revised: 07/11/2013 Document Reviewed: 05/31/2013 [facility] Patient Information 2015 [place], [place]. This information is not intended to replace advice given to you by your health care provider. Make sure you discuss any questions you have with your health care provider. s to follow pending review of pathology report

## 2014-08-09 ENCOUNTER — Encounter: Payer: Self-pay | Admitting: Internal Medicine

## 2014-08-10 ENCOUNTER — Encounter (HOSPITAL_COMMUNITY): Payer: Self-pay | Admitting: Internal Medicine

## 2014-08-28 ENCOUNTER — Ambulatory Visit: Payer: Self-pay | Attending: Internal Medicine | Admitting: Internal Medicine

## 2014-08-28 ENCOUNTER — Encounter: Payer: Self-pay | Admitting: Internal Medicine

## 2014-08-28 VITALS — BP 164/91 | HR 90 | Temp 98.2°F | Resp 15 | Ht 63.0 in | Wt 219.0 lb

## 2014-08-28 DIAGNOSIS — M1711 Unilateral primary osteoarthritis, right knee: Secondary | ICD-10-CM

## 2014-08-28 DIAGNOSIS — E785 Hyperlipidemia, unspecified: Secondary | ICD-10-CM | POA: Insufficient documentation

## 2014-08-28 DIAGNOSIS — M179 Osteoarthritis of knee, unspecified: Secondary | ICD-10-CM | POA: Insufficient documentation

## 2014-08-28 DIAGNOSIS — E119 Type 2 diabetes mellitus without complications: Secondary | ICD-10-CM | POA: Insufficient documentation

## 2014-08-28 DIAGNOSIS — I10 Essential (primary) hypertension: Secondary | ICD-10-CM | POA: Insufficient documentation

## 2014-08-28 DIAGNOSIS — E039 Hypothyroidism, unspecified: Secondary | ICD-10-CM | POA: Insufficient documentation

## 2014-08-28 LAB — GLUCOSE, POCT (MANUAL RESULT ENTRY): POC Glucose: 200 mg/dl — AB (ref 70–99)

## 2014-08-28 LAB — POCT GLYCOSYLATED HEMOGLOBIN (HGB A1C): HEMOGLOBIN A1C: 6.5

## 2014-08-28 MED ORDER — LOSARTAN POTASSIUM 50 MG PO TABS: 50.0000 mg | ORAL_TABLET | Freq: Every day | ORAL | Status: AC

## 2014-08-28 MED ORDER — TRAMADOL HCL 50 MG PO TABS: 50.0000 mg | ORAL_TABLET | Freq: Every day | ORAL | Status: AC | PRN

## 2014-08-28 MED ORDER — OMEPRAZOLE 20 MG PO CPDR: 20.0000 mg | DELAYED_RELEASE_CAPSULE | Freq: Every day | ORAL | Status: DC

## 2014-08-28 MED ORDER — HYDROCHLOROTHIAZIDE 25 MG PO TABS: 25.0000 mg | ORAL_TABLET | Freq: Every day | ORAL | Status: AC

## 2014-08-28 MED ORDER — SIMVASTATIN 20 MG PO TABS: 20.0000 mg | ORAL_TABLET | Freq: Every day | ORAL | Status: AC

## 2014-08-28 MED ORDER — GLUCOSE BLOOD VI STRP: ORAL_STRIP | Status: AC

## 2014-08-28 MED ORDER — METFORMIN HCL 500 MG PO TABS: 500.0000 mg | ORAL_TABLET | Freq: Two times a day (BID) | ORAL | Status: AC

## 2014-08-28 NOTE — Progress Notes (Signed)
Patient ID: Katie Rhodes, female   DOB: 1955-10-15, 59 y.o.   MRN: [id number]   Katie Rhodes, is a 59 y.o. female  ATF:[id number]  YHC:[id number]  DOB - Oct 13, 1955  Chief Complaint  Patient presents with  . Follow-up        Subjective:   Katie Rhodes is a 59 y.o. female here today for a follow up visit. Patient has history of hypertension, diabetes mellitus, hyperlipidemia and hypothyroidism here today for routine follow-up of hypertension and diabetes. She has no significant complaints today except that she wants to know exactly how she should be taking her metformin. Right now she is taking metformin 500 mg tablet by mouth daily instead of twice a day. She recently had her screening colonoscopy which showed diverticulosis otherwise normal. Patient claims blood pressure is normal at home. She says she usually has high blood pressure during doctor's visit. Patient has No headache, No chest pain, No abdominal pain - No Nausea, No new weakness tingling or numbness, No Cough - SOB.  Problem  Primary Osteoarthritis of Right Knee    ALLERGIES: No Known Allergies  PAST MEDICAL HISTORY: Past Medical History  Diagnosis Date  . Hypertension   . Diabetes mellitus   . Thyroid disease   . Hypercholesteremia     MEDICATIONS AT HOME: Prior to Admission medications   Medication Sig Start Date End Date Taking? Authorizing Provider  aspirin 81 MG tablet Take 1 tablet (81 mg total) by mouth daily. 06/12/13  Yes Lorayne Marek, MD  bisacodyl (DULCOLAX) 5 MG EC tablet Take 5 mg by mouth 2 (two) times daily as needed. For constipation    Yes Historical Provider, MD  Blood Glucose Monitoring Suppl (BLOOD GLUCOSE METER) kit Use as instructed 07/18/13  Yes Tresa Garter, MD  Calcium Carbonate-Vitamin D (CALCIUM 500+D PO) Take 1 tablet by mouth daily.    Yes Historical Provider, MD  Cholecalciferol (VITAMIN D-3 PO) Take 1 tablet by mouth daily.    Yes Historical Provider, MD  cyclobenzaprine (FLEXERIL) 10  MG tablet Take 1 tablet (10 mg total) by mouth 3 (three) times daily as needed for muscle spasms. 11/20/13  Yes Aziz Slape E Wilhelmina Hark, MD  Flaxseed, Linseed, (FLAX SEED OIL PO) Take 1,000 mg by mouth daily.     Yes Historical Provider, MD  gabapentin (NEURONTIN) 300 MG capsule Take 1 capsule (300 mg total) by mouth 3 (three) times daily. 05/14/14  Yes Tresa Garter, MD  Glucosamine-Chondroit-Vit C-Mn (GLUCOSAMINE CHONDROITIN COMPLX) CAPS Take 1 capsule by mouth daily.     Yes Historical Provider, MD  glucose blood test strip Use as instructed 08/28/14  Yes Tresa Garter, MD  hydrochlorothiazide (HYDRODIURIL) 25 MG tablet Take 1 tablet (25 mg total) by mouth daily. 08/28/14  Yes Tresa Garter, MD  levothyroxine (SYNTHROID, LEVOTHROID) 150 MCG tablet Take 1 tablet (150 mcg total) by mouth daily. 05/18/14  Yes Tresa Garter, MD  losartan (COZAAR) 50 MG tablet Take 1 tablet (50 mg total) by mouth daily. 08/28/14  Yes Tresa Garter, MD  metFORMIN (GLUCOPHAGE) 500 MG tablet Take 1 tablet (500 mg total) by mouth 2 (two) times daily with a meal. 08/28/14  Yes Katie Nading E Doreene Burke, MD  omeprazole (PRILOSEC) 20 MG capsule Take 1 capsule (20 mg total) by mouth daily. 08/28/14  Yes Tresa Garter, MD  simvastatin (ZOCOR) 20 MG tablet Take 1 tablet (20 mg total) by mouth daily. 08/28/14  Yes Tresa Garter, MD  traMADol (ULTRAM) 50  MG tablet Take 1 tablet (50 mg total) by mouth daily as needed for moderate pain. 08/28/14  Yes Tresa Garter, MD  albuterol (PROVENTIL HFA;VENTOLIN HFA) 108 (90 BASE) MCG/ACT inhaler Inhale 2 puffs into the lungs every 4 (four) hours as needed for wheezing or shortness of breath. Patient not taking: Reported on 08/28/2014 06/17/14   Carvel Getting, NP  peg 3350 powder (MOVIPREP) 100 G SOLR Take 1 kit (200 g total) by mouth once. Patient not taking: Reported on 08/28/2014 08/01/14 08/31/14  Ranae Pila, NP     Objective:   Filed Vitals:   08/28/14 1112    BP: 164/91  Pulse: 90  Temp: 98.2 F (36.8 C)  TempSrc: Oral  Resp: 15  Height: '5\' 3"'  (1.6 m)  Weight: 219 lb (99.338 kg)  SpO2: 95%    Exam General appearance : Awake, alert, not in any distress. Speech Clear. Not toxic looking HEENT: Atraumatic and Normocephalic, pupils equally reactive to light and accomodation Neck: supple, no JVD. No cervical lymphadenopathy.  Chest:Good air entry bilaterally, no added sounds  CVS: S1 S2 regular, no murmurs.  Abdomen: Bowel sounds present, Non tender and not distended with no gaurding, rigidity or rebound. Extremities: B/L Lower Ext shows no edema, both legs are warm to touch Neurology: Awake alert, and oriented X 3, CN II-XII intact, Non focal Skin:No Rash Wounds:N/A  Data Review Lab Results  Component Value Date   HGBA1C 6.50 08/28/2014   HGBA1C 7.5 03/22/2014   HGBA1C 5.9 09/18/2013     Assessment & Plan   1. Type 2 diabetes mellitus without complication  - Glucose (CBG) - HgB A1c is 6.5% today from 7.5% in September 2015.  - metFORMIN (GLUCOPHAGE) 500 MG tablet; Take 1 tablet (500 mg total) by mouth 2 (two) times daily with a meal.  Dispense: 180 tablet; Refill: 3 - omeprazole (PRILOSEC) 20 MG capsule; Take 1 capsule (20 mg total) by mouth daily.  Dispense: 90 capsule; Refill: 3 - glucose blood test strip; Use as instructed  Dispense: 100 each; Refill: 12  Aim for 2-3 Carb Choices per meal (30-45 grams) +/- 1 either way  Aim for 0-15 Carbs per snack if hungry  Include protein in moderation with your meals and snacks  Consider reading food labels for Total Carbohydrate and Fat Grams of foods  Consider checking BG at alternate times per day  Continue taking medication as directed Fruit Punch - find one with no sugar  Measure and decrease portions of carbohydrate foods  Make your plate and don't go back for seconds   2. Hyperlipidemia  - simvastatin (ZOCOR) 20 MG tablet; Take 1 tablet (20 mg total) by mouth daily.   Dispense: 90 tablet; Refill: 3  3. Essential hypertension, benign  - losartan (COZAAR) 50 MG tablet; Take 1 tablet (50 mg total) by mouth daily.  Dispense: 90 tablet; Refill: 3 - hydrochlorothiazide (HYDRODIURIL) 25 MG tablet; Take 1 tablet (25 mg total) by mouth daily.  Dispense: 90 tablet; Refill: 3  4. Primary osteoarthritis of right knee  - traMADol (ULTRAM) 50 MG tablet; Take 1 tablet (50 mg total) by mouth daily as needed for moderate pain.  Dispense: 30 tablet; Refill: 0  Patient was extensively counseled about nutrition and exercise. Interpreter was used to communicate directly with patient for the entire encounter including providing detailed patient instructions.   Return in about 4 weeks (around 09/25/2014) for BP Check, Nurse Visit, CBG, Lab/Nurse Visit.. Blood pressure remains high with current  medications, will increase losartan 100 mg tablet by mouth daily.  The patient was given clear instructions to go to ER or return to medical center if symptoms don't improve, worsen or new problems develop. The patient verbalized understanding. The patient was told to call to get lab results if they haven't heard anything in the next week.   This note has been created with Surveyor, quantity. Any transcriptional errors are unintentional.    Angelica Chessman, MD, [place], [place], [place] and [place], [place]   08/28/2014, 12:10 PM

## 2014-08-28 NOTE — Progress Notes (Signed)
Pt is here following up on her HTN and diabetes. Pt has some confusion with how to take her metformin.  Pt has an interpreter.

## 2014-08-28 NOTE — Patient Instructions (Signed)
Hypertension Hypertension, commonly called high blood pressure, is when the force of blood pumping through your arteries is too strong. Your arteries are the blood vessels that carry blood from your heart throughout your body. A blood pressure reading consists of a higher number over a lower number, such as 110/72. The higher number (systolic) is the pressure inside your arteries when your heart pumps. The lower number (diastolic) is the pressure inside your arteries when your heart relaxes. Ideally you want your blood pressure below 120/80. Hypertension forces your heart to work harder to pump blood. Your arteries may become narrow or stiff. Having hypertension puts you at risk for heart disease, stroke, and other problems.  RISK FACTORS Some risk factors for high blood pressure are controllable. Others are not.  Risk factors you cannot control include:   Race. You may be at higher risk if you are African American.  Age. Risk increases with age.  Gender. Men are at higher risk than women before age 45 years. After age 65, women are at higher risk than men. Risk factors you can control include:  Not getting enough exercise or physical activity.  Being overweight.  Getting too much fat, sugar, calories, or salt in your diet.  Drinking too much alcohol. SIGNS AND SYMPTOMS Hypertension does not usually cause signs or symptoms. Extremely high blood pressure (hypertensive crisis) may cause headache, anxiety, shortness of breath, and nosebleed. DIAGNOSIS  To check if you have hypertension, your health care provider will measure your blood pressure while you are seated, with your arm held at the level of your heart. It should be measured at least twice using the same arm. Certain conditions can cause a difference in blood pressure between your right and left arms. A blood pressure reading that is higher than normal on one occasion does not mean that you need treatment. If one blood pressure reading  is high, ask your health care provider about having it checked again. TREATMENT  Treating high blood pressure includes making lifestyle changes and possibly taking medicine. Living a healthy lifestyle can help lower high blood pressure. You may need to change some of your habits. Lifestyle changes may include:  Following the DASH diet. This diet is high in fruits, vegetables, and whole grains. It is low in salt, red meat, and added sugars.  Getting at least 2 hours of brisk physical activity every week.  Losing weight if necessary.  Not smoking.  Limiting alcoholic beverages.  Learning ways to reduce stress. If lifestyle changes are not enough to get your blood pressure under control, your health care provider may prescribe medicine. You may need to take more than one. Work closely with your health care provider to understand the risks and benefits. HOME CARE INSTRUCTIONS  Have your blood pressure rechecked as directed by your health care provider.   Take medicines only as directed by your health care provider. Follow the directions carefully. Blood pressure medicines must be taken as prescribed. The medicine does not work as well when you skip doses. Skipping doses also puts you at risk for problems.   Do not smoke.   Monitor your blood pressure at home as directed by your health care provider. SEEK MEDICAL CARE IF:   You think you are having a reaction to medicines taken.  You have recurrent headaches or feel dizzy.  You have swelling in your ankles.  You have trouble with your vision. SEEK IMMEDIATE MEDICAL CARE IF:  You develop a severe headache or confusion.    You have unusual weakness, numbness, or feel faint.  You have severe chest or abdominal pain.  You vomit repeatedly.  You have trouble breathing. MAKE SURE YOU:   Understand these instructions.  Will watch your condition.  Will get help right away if you are not doing well or get worse. Document  Released: 07/06/2005 Document Revised: 11/20/2013 Document Reviewed: 04/28/2013 [facility] Patient Information 2015 [place], [place]. This information is not intended to replace advice given to you by your health care provider. Make sure you discuss any questions you have with your health care provider. Diabetes and Exercise Exercising regularly is important. It is not just about losing weight. It has many health benefits, such as:  Improving your overall fitness, flexibility, and endurance.  Increasing your bone density.  Helping with weight control.  Decreasing your body fat.  Increasing your muscle strength.  Reducing stress and tension.  Improving your overall health. People with diabetes who exercise gain additional benefits because exercise:  Reduces appetite.  Improves the body's use of blood sugar (glucose).  Helps lower or control blood glucose.  Decreases blood pressure.  Helps control blood lipids (such as cholesterol and triglycerides).  Improves the body's use of the hormone insulin by:  Increasing the body's insulin sensitivity.  Reducing the body's insulin needs.  Decreases the risk for heart disease because exercising:  Lowers cholesterol and triglycerides levels.  Increases the levels of good cholesterol (such as high-density lipoproteins [HDL]) in the body.  Lowers blood glucose levels. YOUR ACTIVITY PLAN  Choose an activity that you enjoy and set realistic goals. Your health care provider or diabetes educator can help you make an activity plan that works for you. Exercise regularly as directed by your health care provider. This includes:  Performing resistance training twice a week such as push-ups, sit-ups, lifting weights, or using resistance bands.  Performing 150 minutes of cardio exercises each week such as walking, running, or playing sports.  Staying active and spending no more than 90 minutes at one time being inactive. Even short bursts of  exercise are good for you. Three 10-minute sessions spread throughout the day are just as beneficial as a single 30-minute session. Some exercise ideas include:  Taking the dog for a walk.  Taking the stairs instead of the elevator.  Dancing to your favorite song.  Doing an exercise video.  Doing your favorite exercise with a friend. RECOMMENDATIONS FOR EXERCISING WITH TYPE 1 OR TYPE 2 DIABETES   Check your blood glucose before exercising. If blood glucose levels are greater than 240 mg/dL, check for urine ketones. Do not exercise if ketones are present.  Avoid injecting insulin into areas of the body that are going to be exercised. For example, avoid injecting insulin into:  The arms when playing tennis.  The legs when jogging.  Keep a record of:  Food intake before and after you exercise.  Expected peak times of insulin action.  Blood glucose levels before and after you exercise.  The type and amount of exercise you have done.  Review your records with your health care provider. Your health care provider will help you to develop guidelines for adjusting food intake and insulin amounts before and after exercising.  If you take insulin or oral hypoglycemic agents, watch for signs and symptoms of hypoglycemia. They include:  Dizziness.  Shaking.  Sweating.  Chills.  Confusion.  Drink plenty of water while you exercise to prevent dehydration or heat stroke. Body water is lost during exercise and  must be replaced.  Talk to your health care provider before starting an exercise program to make sure it is safe for you. Remember, almost any type of activity is better than none. Document Released: 09/26/2003 Document Revised: 11/20/2013 Document Reviewed: 12/13/2012 [facility] Patient Information 2015 [place], [place]. This information is not intended to replace advice given to you by your health care provider. Make sure you discuss any questions you have with your health care  provider. Basic Carbohydrate Counting for Diabetes Mellitus Carbohydrate counting is a method for keeping track of the amount of carbohydrates you eat. Eating carbohydrates naturally increases the level of sugar (glucose) in your blood, so it is important for you to know the amount that is okay for you to have in every meal. Carbohydrate counting helps keep the level of glucose in your blood within normal limits. The amount of carbohydrates allowed is different for every person. A dietitian can help you calculate the amount that is right for you. Once you know the amount of carbohydrates you can have, you can count the carbohydrates in the foods you want to eat. Carbohydrates are found in the following foods:  Grains, such as breads and cereals.  Dried beans and soy products.  Starchy vegetables, such as potatoes, peas, and corn.  Fruit and fruit juices.  Milk and yogurt.  Sweets and snack foods, such as cake, cookies, candy, chips, soft drinks, and fruit drinks. CARBOHYDRATE COUNTING There are two ways to count the carbohydrates in your food. You can use either of the methods or a combination of both. Reading the "Nutrition Facts" on [place] The "Nutrition Facts" is an area that is included on the labels of almost all packaged food and beverages in the [place]. It includes the serving size of that food or beverage and information about the nutrients in each serving of the food, including the grams (g) of carbohydrate per serving.  Decide the number of servings of this food or beverage that you will be able to eat or drink. Multiply that number of servings by the number of grams of carbohydrate that is listed on the label for that serving. The total will be the amount of carbohydrates you will be having when you eat or drink this food or beverage. Learning Standard Serving Sizes of Food When you eat food that is not packaged or does not include "Nutrition Facts" on the label, you  need to measure the servings in order to count the amount of carbohydrates.A serving of most carbohydrate-rich foods contains about 15 g of carbohydrates. The following list includes serving sizes of carbohydrate-rich foods that provide 15 g ofcarbohydrate per serving:   1 slice of bread (1 oz) or 1 six-inch tortilla.    of a hamburger bun or English muffin.  4-6 crackers.   cup unsweetened dry cereal.    cup hot cereal.   cup rice or pasta.    cup mashed potatoes or  of a large baked potato.  1 cup fresh fruit or one small piece of fruit.    cup canned or frozen fruit or fruit juice.  1 cup milk.   cup plain fat-free yogurt or yogurt sweetened with artificial sweeteners.   cup cooked dried beans or starchy vegetable, such as peas, corn, or potatoes.  Decide the number of standard-size servings that you will eat. Multiply that number of servings by 15 (the grams of carbohydrates in that serving). For example, if you eat 2 cups of strawberries, you will  have eaten 2 servings and 30 g of carbohydrates (2 servings x 15 g = 30 g). For foods such as soups and casseroles, in which more than one food is mixed in, you will need to count the carbohydrates in each food that is included. EXAMPLE OF CARBOHYDRATE COUNTING Sample Dinner  3 oz chicken breast.   cup of brown rice.   cup of corn.  1 cup milk.   1 cup strawberries with sugar-free whipped topping.  Carbohydrate Calculation Step 1: Identify the foods that contain carbohydrates:   Rice.   Corn.   Milk.   Strawberries. Step 2:Calculate the number of servings eaten of each:   2 servings of rice.   1 serving of corn.   1 serving of milk.   1 serving of strawberries. Step 3: Multiply each of those number of servings by 15 g:   2 servings of rice x 15 g = 30 g.   1 serving of corn x 15 g = 15 g.   1 serving of milk x 15 g = 15 g.   1 serving of strawberries x 15 g = 15 g. Step 4:  Add together all of the amounts to find the total grams of carbohydrates eaten: 30 g + 15 g + 15 g + 15 g = 75 g. Document Released: 07/06/2005 Document Revised: 11/20/2013 Document Reviewed: 06/02/2013 [facility] Patient Information 2015 [place], [place]. This information is not intended to replace advice given to you by your health care provider. Make sure you discuss any questions you have with your health care provider. DASH Eating Plan DASH stands for "Dietary Approaches to Stop Hypertension." The DASH eating plan is a healthy eating plan that has been shown to reduce high blood pressure (hypertension). Additional health benefits may include reducing the risk of type 2 diabetes mellitus, heart disease, and stroke. The DASH eating plan may also help with weight loss. WHAT DO I NEED TO KNOW ABOUT THE DASH EATING PLAN? For the DASH eating plan, you will follow these general guidelines:  Choose foods with a percent daily value for sodium of less than 5% (as listed on the food label).  Use salt-free seasonings or herbs instead of table salt or sea salt.  Check with your health care provider or pharmacist before using salt substitutes.  Eat lower-sodium products, often labeled as "lower sodium" or "no salt added."  Eat fresh foods.  Eat more vegetables, fruits, and low-fat dairy products.  Choose whole grains. Look for the word "whole" as the first word in the ingredient list.  Choose fish and skinless chicken or [place] more often than red meat. Limit fish, poultry, and meat to 6 oz (170 g) each day.  Limit sweets, desserts, sugars, and sugary drinks.  Choose heart-healthy fats.  Limit cheese to 1 oz (28 g) per day.  Eat more home-cooked food and less restaurant, buffet, and fast food.  Limit fried foods.  Cook foods using methods other than frying.  Limit canned vegetables. If you do use them, rinse them well to decrease the sodium.  When eating at a restaurant, ask that your food  be prepared with less salt, or no salt if possible. WHAT FOODS CAN I EAT? Seek help from a dietitian for individual calorie needs. Grains Whole grain or whole wheat bread. Brown rice. Whole grain or whole wheat pasta. Quinoa, bulgur, and whole grain cereals. Low-sodium cereals. Corn or whole wheat flour tortillas. Whole grain cornbread. Whole grain crackers. Low-sodium crackers. Vegetables  Fresh or frozen vegetables (raw, steamed, roasted, or grilled). Low-sodium or reduced-sodium tomato and vegetable juices. Low-sodium or reduced-sodium tomato sauce and paste. Low-sodium or reduced-sodium canned vegetables.  Fruits All fresh, canned (in natural juice), or frozen fruits. Meat and Other Protein Products Ground beef (85% or leaner), grass-fed beef, or beef trimmed of fat. Skinless chicken or [place]. Ground chicken or [place]. Pork trimmed of fat. All fish and seafood. Eggs. Dried beans, peas, or lentils. Unsalted nuts and seeds. Unsalted canned beans. Dairy Low-fat dairy products, such as skim or 1% milk, 2% or reduced-fat cheeses, low-fat ricotta or cottage cheese, or plain low-fat yogurt. Low-sodium or reduced-sodium cheeses. Fats and Oils Tub margarines without trans fats. Light or reduced-fat mayonnaise and salad dressings (reduced sodium). Avocado. Safflower, olive, or canola oils. Natural peanut or almond butter. Other Unsalted popcorn and pretzels. The items listed above may not be a complete list of recommended foods or beverages. Contact your dietitian for more options. WHAT FOODS ARE NOT RECOMMENDED? Grains White bread. White pasta. White rice. Refined cornbread. Bagels and croissants. Crackers that contain trans fat. Vegetables Creamed or fried vegetables. Vegetables in a cheese sauce. Regular canned vegetables. Regular canned tomato sauce and paste. Regular tomato and vegetable juices. Fruits Dried fruits. Canned fruit in light or heavy syrup. Fruit juice. Meat and Other Protein  Products Fatty cuts of meat. Ribs, chicken wings, bacon, sausage, bologna, salami, chitterlings, fatback, hot dogs, bratwurst, and packaged luncheon meats. Salted nuts and seeds. Canned beans with salt. Dairy Whole or 2% milk, cream, half-and-half, and cream cheese. Whole-fat or sweetened yogurt. Full-fat cheeses or blue cheese. Nondairy creamers and whipped toppings. Processed cheese, cheese spreads, or cheese curds. Condiments Onion and garlic salt, seasoned salt, table salt, and sea salt. Canned and packaged gravies. Worcestershire sauce. Tartar sauce. Barbecue sauce. Teriyaki sauce. Soy sauce, including reduced sodium. Steak sauce. Fish sauce. Oyster sauce. Cocktail sauce. Horseradish. Ketchup and mustard. Meat flavorings and tenderizers. Bouillon cubes. Hot sauce. Tabasco sauce. Marinades. Taco seasonings. Relishes. Fats and Oils Butter, stick margarine, lard, shortening, ghee, and bacon fat. Coconut, palm kernel, or palm oils. Regular salad dressings. Other Pickles and olives. Salted popcorn and pretzels. The items listed above may not be a complete list of foods and beverages to avoid. Contact your dietitian for more information. WHERE CAN I FIND MORE INFORMATION? National Heart, Lung, and Blood Institute: travelstabloid.com Document Released: 06/25/2011 Document Revised: 11/20/2013 Document Reviewed: 05/10/2013 [facility] Patient Information 2015 [place], [place]. This information is not intended to replace advice given to you by your health care provider. Make sure you discuss any questions you have with your health care provider.

## 2014-09-21 ENCOUNTER — Ambulatory Visit: Payer: Self-pay | Attending: Internal Medicine

## 2014-11-28 ENCOUNTER — Ambulatory Visit: Payer: 59

## 2014-11-29 ENCOUNTER — Other Ambulatory Visit (HOSPITAL_COMMUNITY): Payer: Self-pay | Admitting: Internal Medicine

## 2014-11-29 DIAGNOSIS — I739 Peripheral vascular disease, unspecified: Secondary | ICD-10-CM

## 2014-12-03 ENCOUNTER — Ambulatory Visit (HOSPITAL_COMMUNITY)
Admission: RE | Admit: 2014-12-03 | Discharge: 2014-12-03 | Disposition: A | Discharge status: Home / Self Care | Payer: 59 | Source: Ambulatory Visit | Attending: Internal Medicine | Admitting: Internal Medicine

## 2014-12-03 DIAGNOSIS — R200 Anesthesia of skin: Secondary | ICD-10-CM | POA: Diagnosis not present

## 2014-12-03 DIAGNOSIS — I739 Peripheral vascular disease, unspecified: Secondary | ICD-10-CM | POA: Diagnosis not present

## 2014-12-03 DIAGNOSIS — E119 Type 2 diabetes mellitus without complications: Secondary | ICD-10-CM | POA: Diagnosis not present

## 2014-12-03 DIAGNOSIS — M79669 Pain in unspecified lower leg: Secondary | ICD-10-CM | POA: Insufficient documentation

## 2014-12-03 NOTE — Progress Notes (Signed)
VASCULAR LAB PRELIMINARY  PRELIMINARY  PRELIMINARY  PRELIMINARY  Bilateral lower extremity arterial duplex completed.    Preliminary report:  Bilateral lower extremity arterial duplex indicates no evidence of significant stenosis. All Doppler waveforms were triphasic except for the distal pedal waveforms which were biphasic and can be considered normal in a patient that is a diabetic  Zymeir Salminen, RVS 12/03/2014, 12:56 PM

## 2015-01-14 ENCOUNTER — Other Ambulatory Visit: Payer: Self-pay | Admitting: Internal Medicine

## 2015-01-14 DIAGNOSIS — Z1231 Encounter for screening mammogram for malignant neoplasm of breast: Secondary | ICD-10-CM

## 2015-02-12 ENCOUNTER — Ambulatory Visit
Admission: RE | Admit: 2015-02-12 | Discharge: 2015-02-12 | Disposition: A | Discharge status: Home / Self Care | Payer: 59 | Source: Ambulatory Visit | Attending: Internal Medicine | Admitting: Internal Medicine

## 2015-02-12 DIAGNOSIS — Z1231 Encounter for screening mammogram for malignant neoplasm of breast: Secondary | ICD-10-CM

## 2015-02-13 ENCOUNTER — Other Ambulatory Visit: Payer: Self-pay | Admitting: Internal Medicine

## 2015-02-13 DIAGNOSIS — R928 Other abnormal and inconclusive findings on diagnostic imaging of breast: Secondary | ICD-10-CM

## 2015-02-28 ENCOUNTER — Ambulatory Visit
Admission: RE | Admit: 2015-02-28 | Discharge: 2015-02-28 | Disposition: A | Discharge status: Home / Self Care | Payer: 59 | Source: Ambulatory Visit | Attending: Internal Medicine | Admitting: Internal Medicine

## 2015-02-28 DIAGNOSIS — R928 Other abnormal and inconclusive findings on diagnostic imaging of breast: Secondary | ICD-10-CM

## 2015-03-05 ENCOUNTER — Telehealth: Payer: Self-pay

## 2015-03-05 NOTE — Telephone Encounter (Signed)
-----   Message from Tresa Garter, MD sent at 02/20/2015 12:13 PM EDT ----- Please remind patient that it is recommended to have Diagnostic mammogram and possibly ultrasound of the left breast done based on the results from the screening mammogram.

## 2015-03-05 NOTE — Telephone Encounter (Signed)
Nurse called patient via [employer], [place]. Interpreter left message for patient to return call to [facility] with [facility] at [phone].

## 2015-03-22 NOTE — Telephone Encounter (Signed)
-----   Message from Olugbemiga E Jegede, MD sent at 02/20/2015 12:13 PM EDT ----- Please remind patient that it is recommended to have Diagnostic mammogram and possibly ultrasound of the left breast done based on the results from the screening mammogram. 

## 2015-03-22 NOTE — Telephone Encounter (Signed)
Nurse called patient via [employer], [place]. Interpreter reached voicemail, left message for patient to return call to [facility] with [facility] at [phone].

## 2015-04-02 LAB — HM DIABETES EYE EXAM

## 2015-04-06 IMAGING — DX DG CHEST 2V
2 series · 2 of 2 positions shown · non-contrast
Comparison: 07/14/2013

CLINICAL DATA: 58-year-old with productive cough and hypertension.

EXAM:
CHEST  2 VIEW

[chest pa]
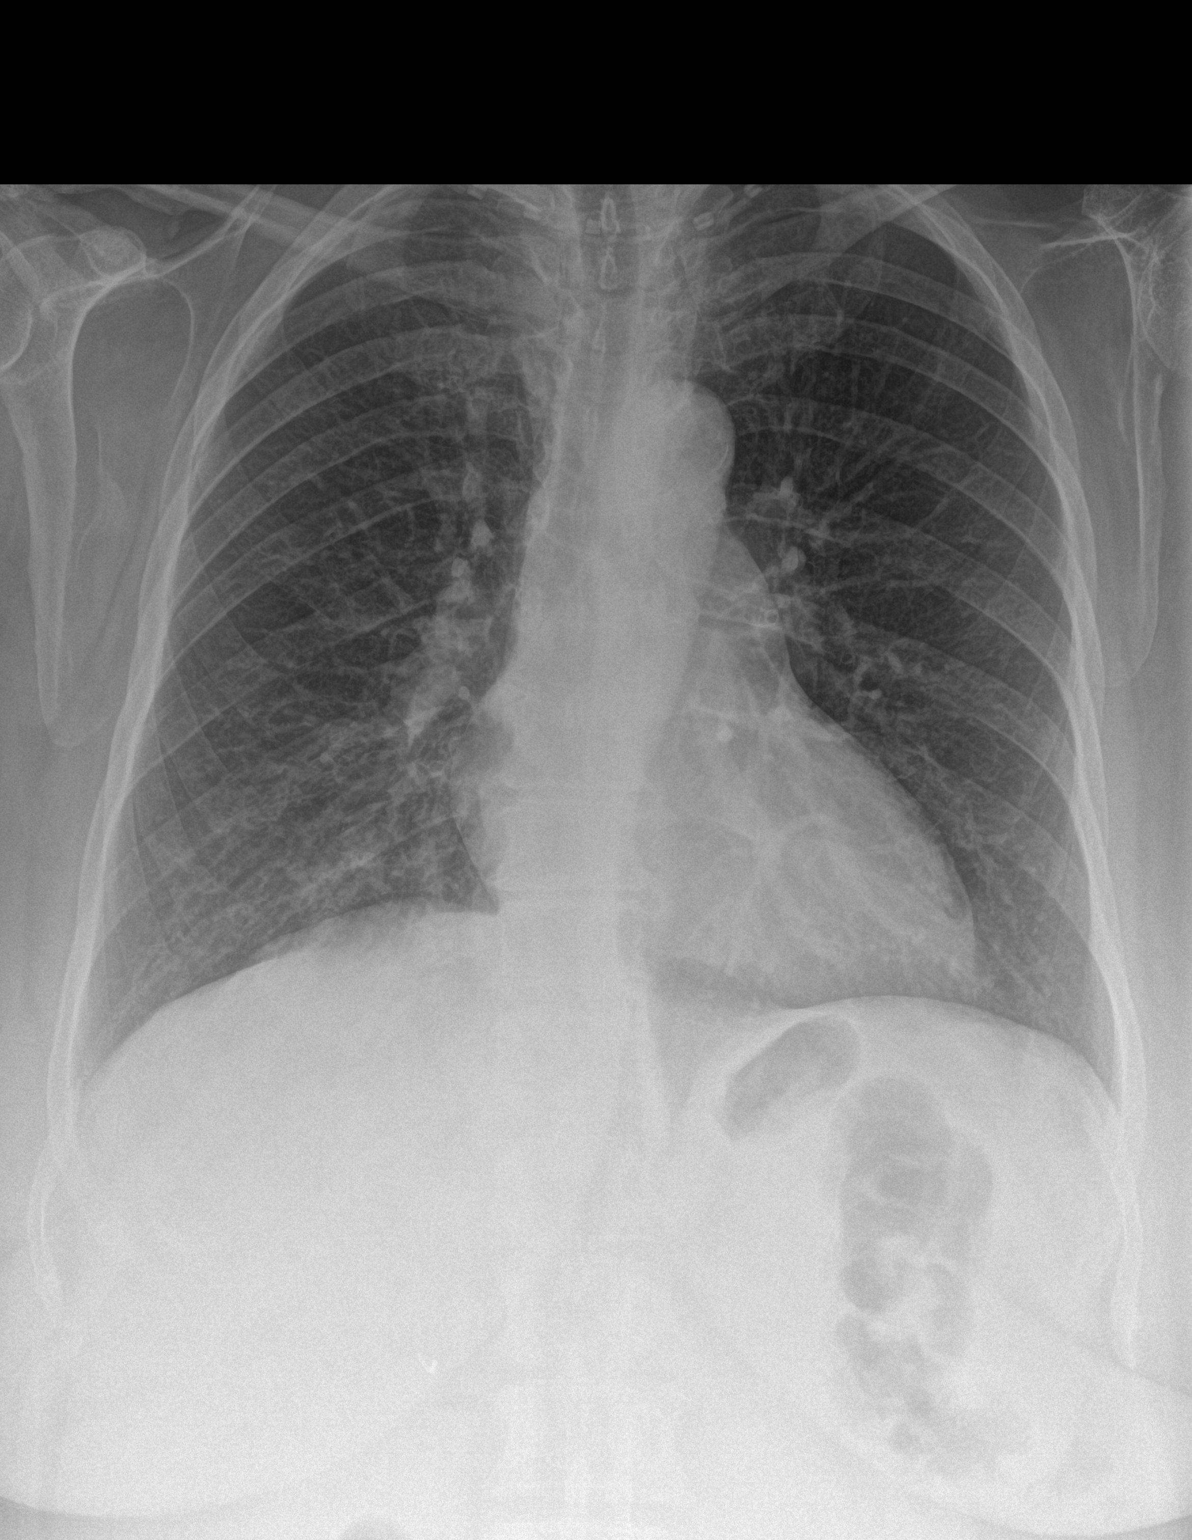

[chest lat]
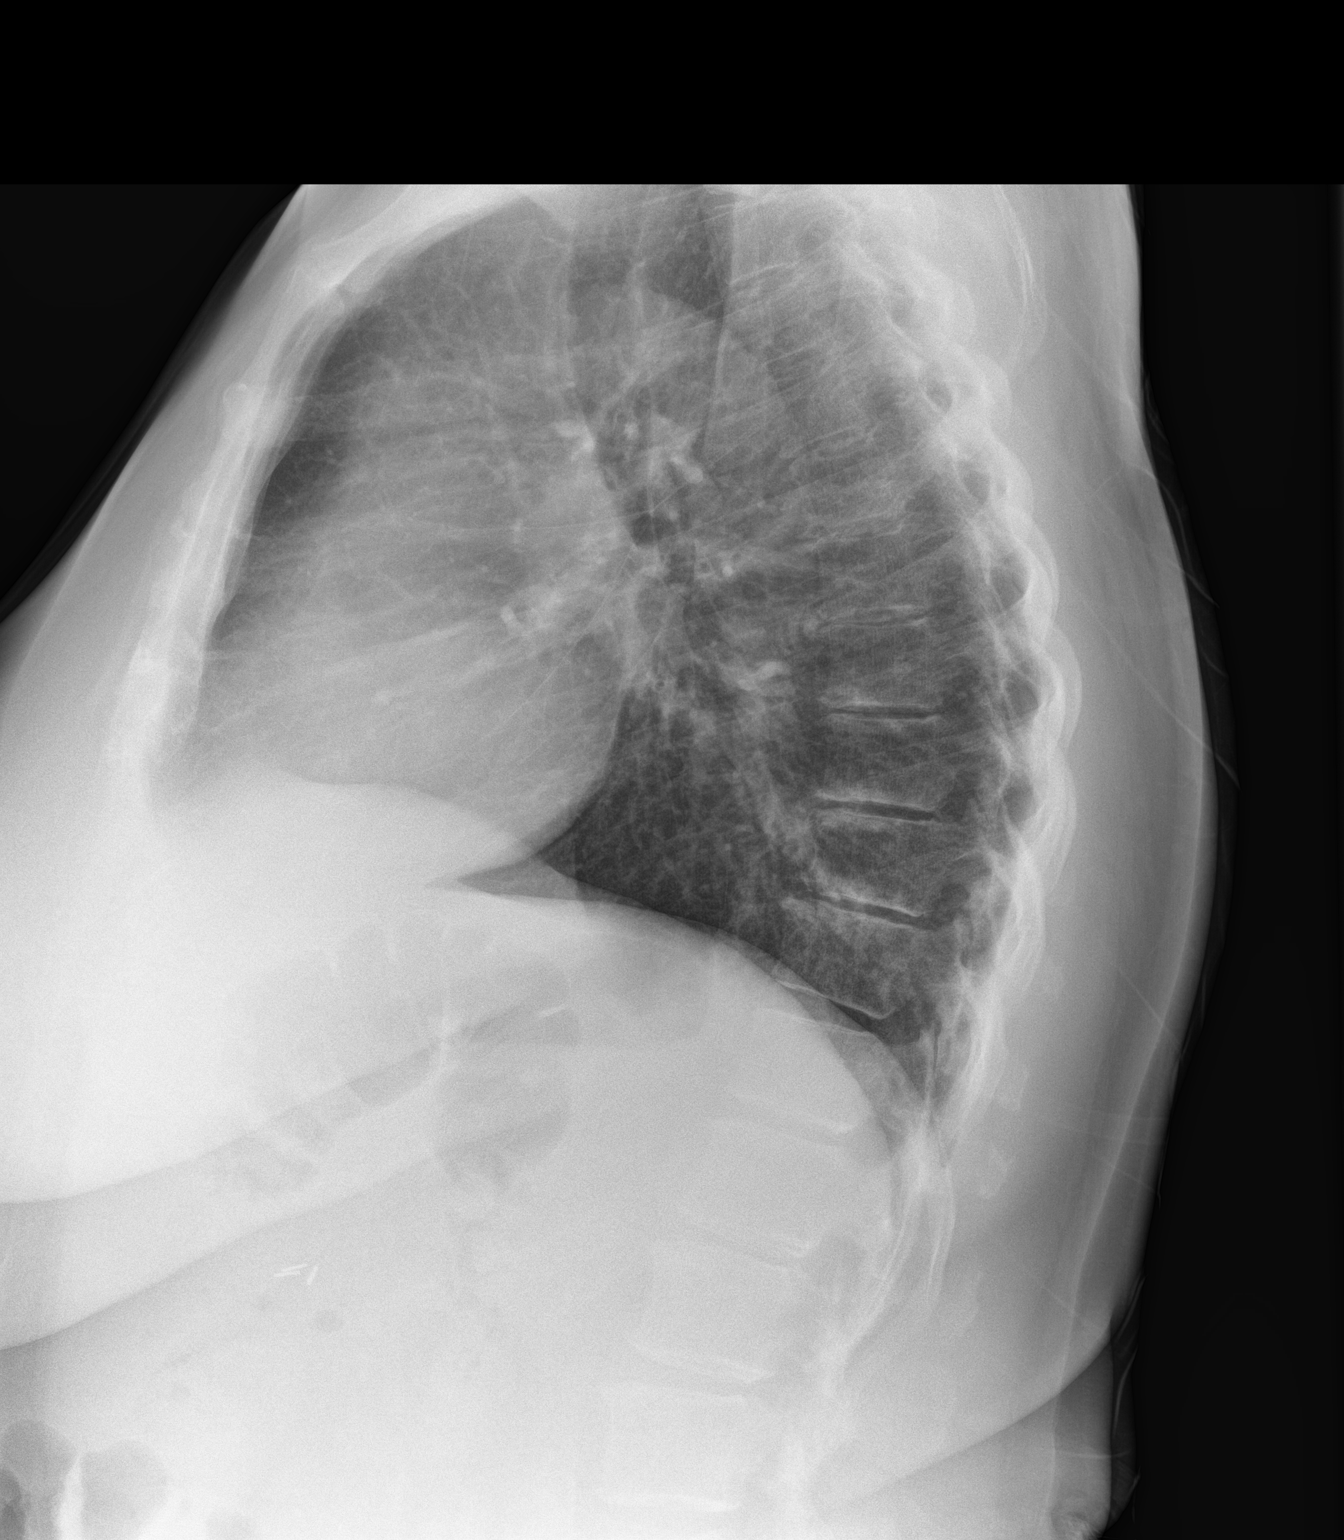

[2 of 2 positions shown; findings below may reference images not displayed]

FINDINGS: The lung markings are prominent but probably related to technique.
There is no evidence for airspace disease or edema. Heart and
mediastinum are within normal limits. Trachea is midline. Negative
for pleural effusions. No acute bone abnormality. Surgical clips in
the right upper abdomen.
IMPRESSION: No active cardiopulmonary disease.

## 2015-04-12 ENCOUNTER — Encounter: Payer: Self-pay | Admitting: Internal Medicine

## 2015-04-22 ENCOUNTER — Telehealth: Payer: Self-pay | Admitting: *Deleted

## 2015-04-22 NOTE — Telephone Encounter (Signed)
Medical Assistant used [employer] to contact patient.  Interpreter Name: Lina Sar Interpreter #: [phone] Medical Assistant left message on patient's home and cell voicemail. Voicemail states to give a call back to [place] with [facility] at [phone].

## 2015-04-22 NOTE — Telephone Encounter (Signed)
-----   Message from Nila Nephew, RN sent at 04/22/2015  2:05 PM EDT -----   ----- Message -----    From: Tresa Garter, MD    Sent: 02/20/2015  12:13 PM      To: Nila Nephew, RN  Please remind patient that it is recommended to have Diagnostic mammogram and possibly ultrasound of the left breast done based on the results from the screening mammogram.

## 2015-04-24 NOTE — Telephone Encounter (Signed)
-----   Message from Nila Nephew, RN sent at 04/22/2015  2:05 PM EDT -----   ----- Message -----    From: Tresa Garter, MD    Sent: 02/20/2015  12:13 PM      To: Nila Nephew, RN  Please remind patient that it is recommended to have Diagnostic mammogram and possibly ultrasound of the left breast done based on the results from the screening mammogram.

## 2015-04-24 NOTE — Telephone Encounter (Signed)
Medical Assistant used [employer] to contact patient.  Interpreter Name: fehr Interpreter #: [phone] Medical Assistant left message on patient's cell voicemail. Voicemail states to give a call back to [place] with [facility] at [phone].

## 2015-07-11 ENCOUNTER — Telehealth: Payer: Self-pay

## 2015-07-11 NOTE — Telephone Encounter (Signed)
error 

## 2015-08-13 ENCOUNTER — Other Ambulatory Visit: Payer: Self-pay | Admitting: Internal Medicine

## 2015-08-13 DIAGNOSIS — E2839 Other primary ovarian failure: Secondary | ICD-10-CM

## 2015-09-04 ENCOUNTER — Other Ambulatory Visit: Payer: Self-pay | Admitting: Internal Medicine

## 2015-09-04 DIAGNOSIS — Z1231 Encounter for screening mammogram for malignant neoplasm of breast: Secondary | ICD-10-CM

## 2015-12-04 IMAGING — MG MM DIAG BREAST TOMO UNI LEFT
4 series · 4 of 12 positions shown · non-contrast
Comparison: Previous examinations, including the screening
mammogram dated 02/12/2015.

CLINICAL DATA: Possible mass in the central left breast on a recent
craniocaudal screening mammogram view.

EXAM:
DIGITAL DIAGNOSTIC LEFT MAMMOGRAM WITH 3D TOMOSYNTHESIS AND CAD

[L MLO]
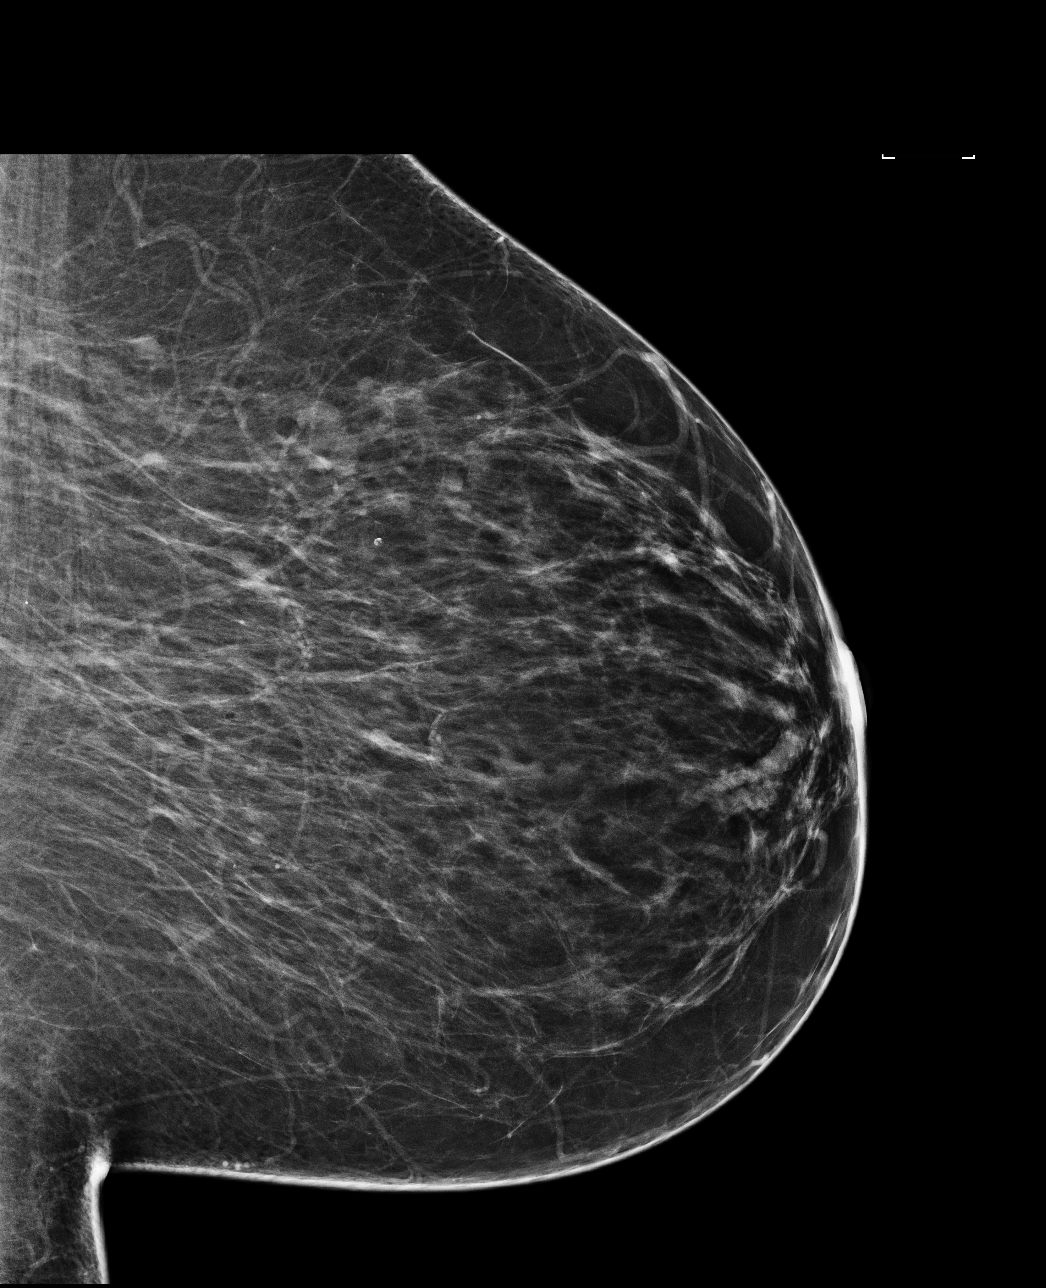

[L CC]
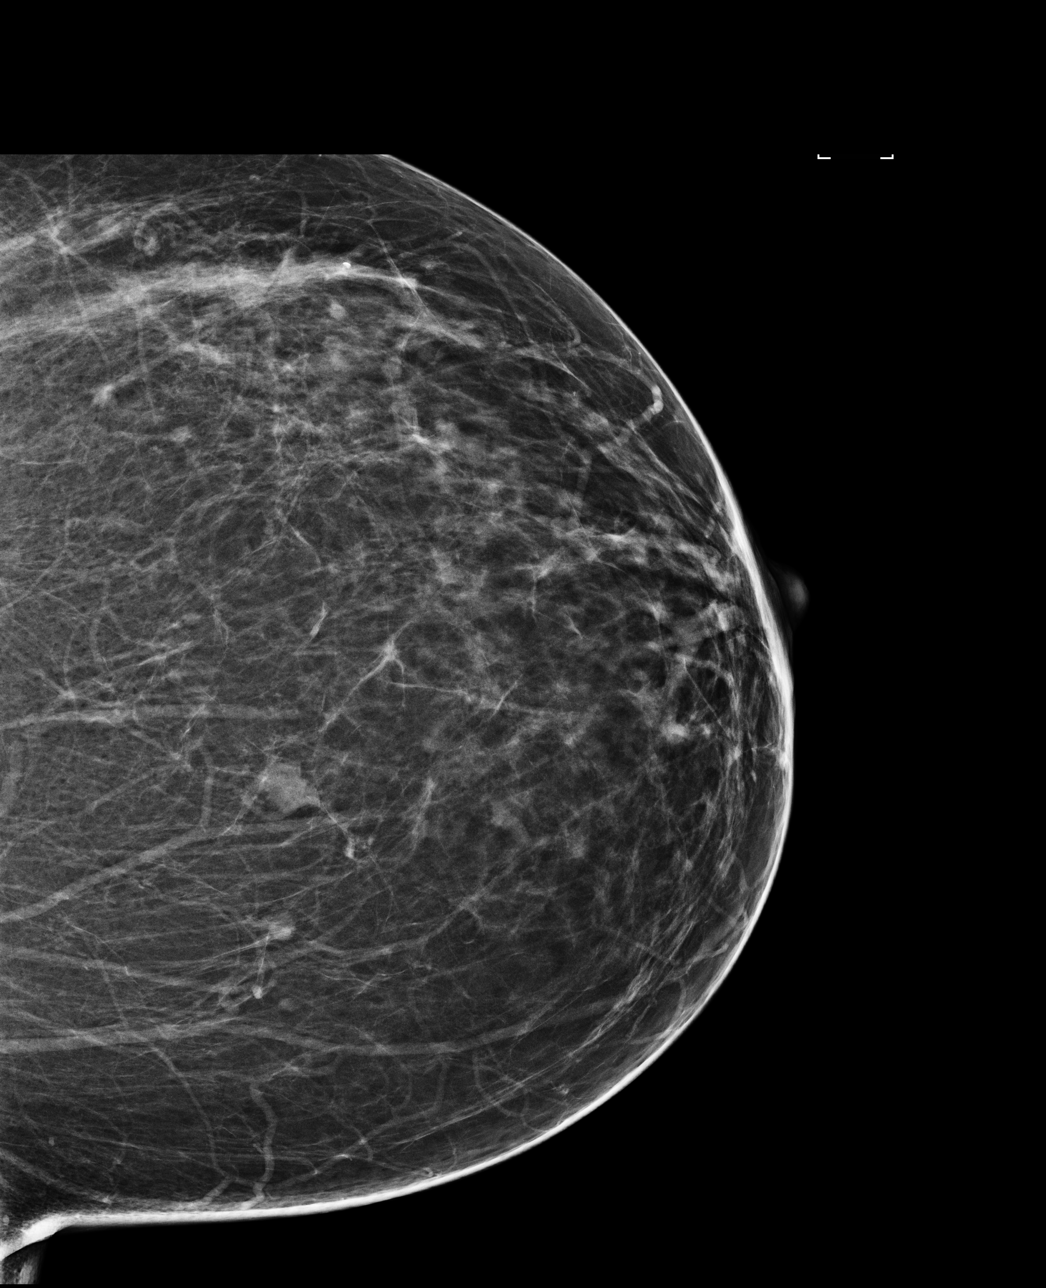

[L CC tomo · tomo slice 34/67.0]
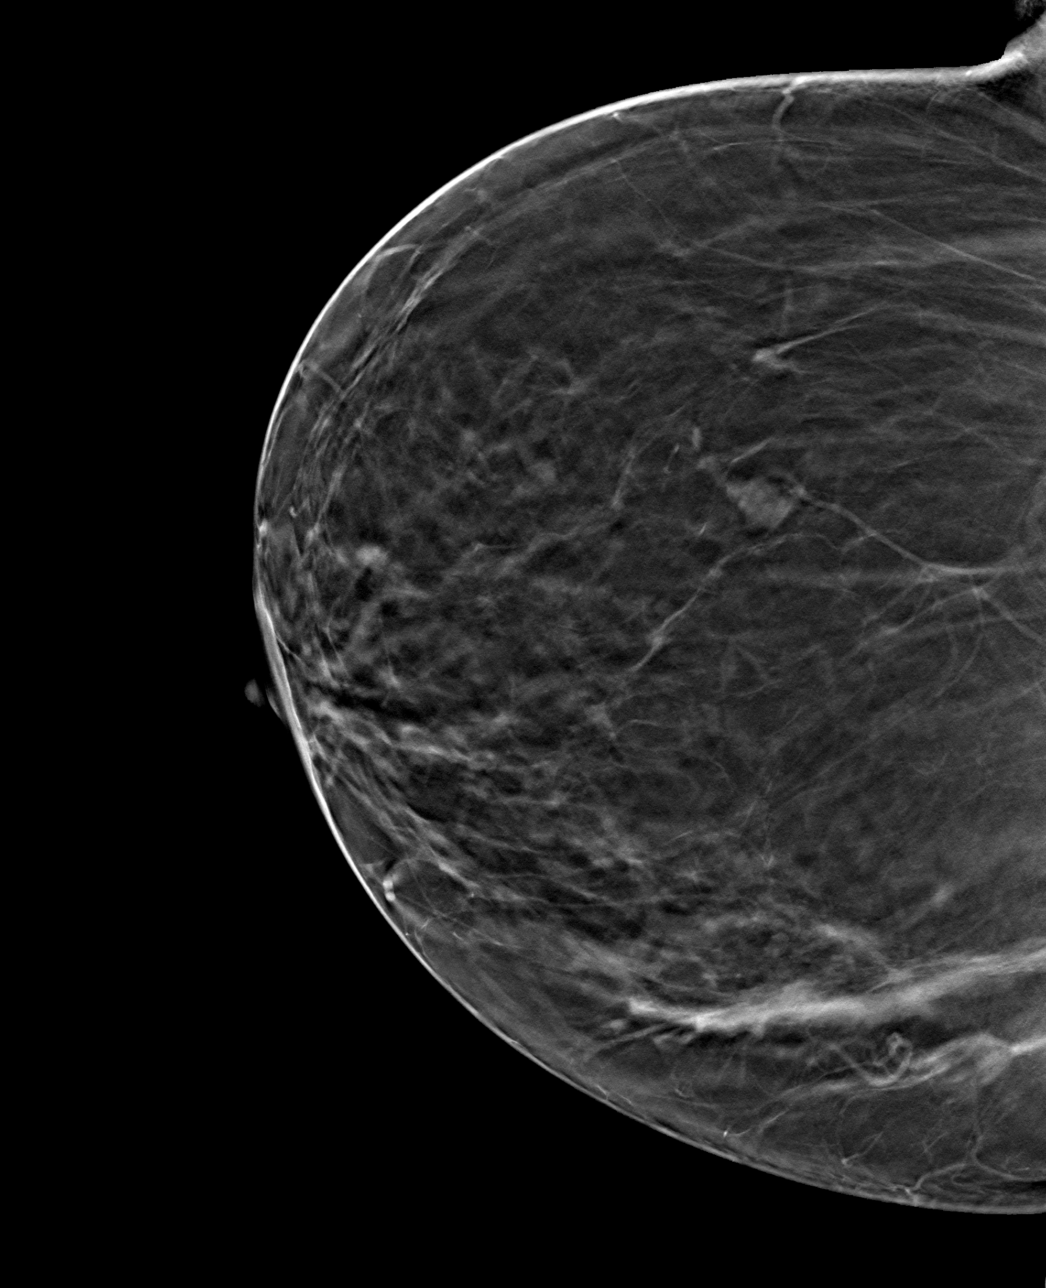

[L MLO tomo · tomo slice 38/75.0]
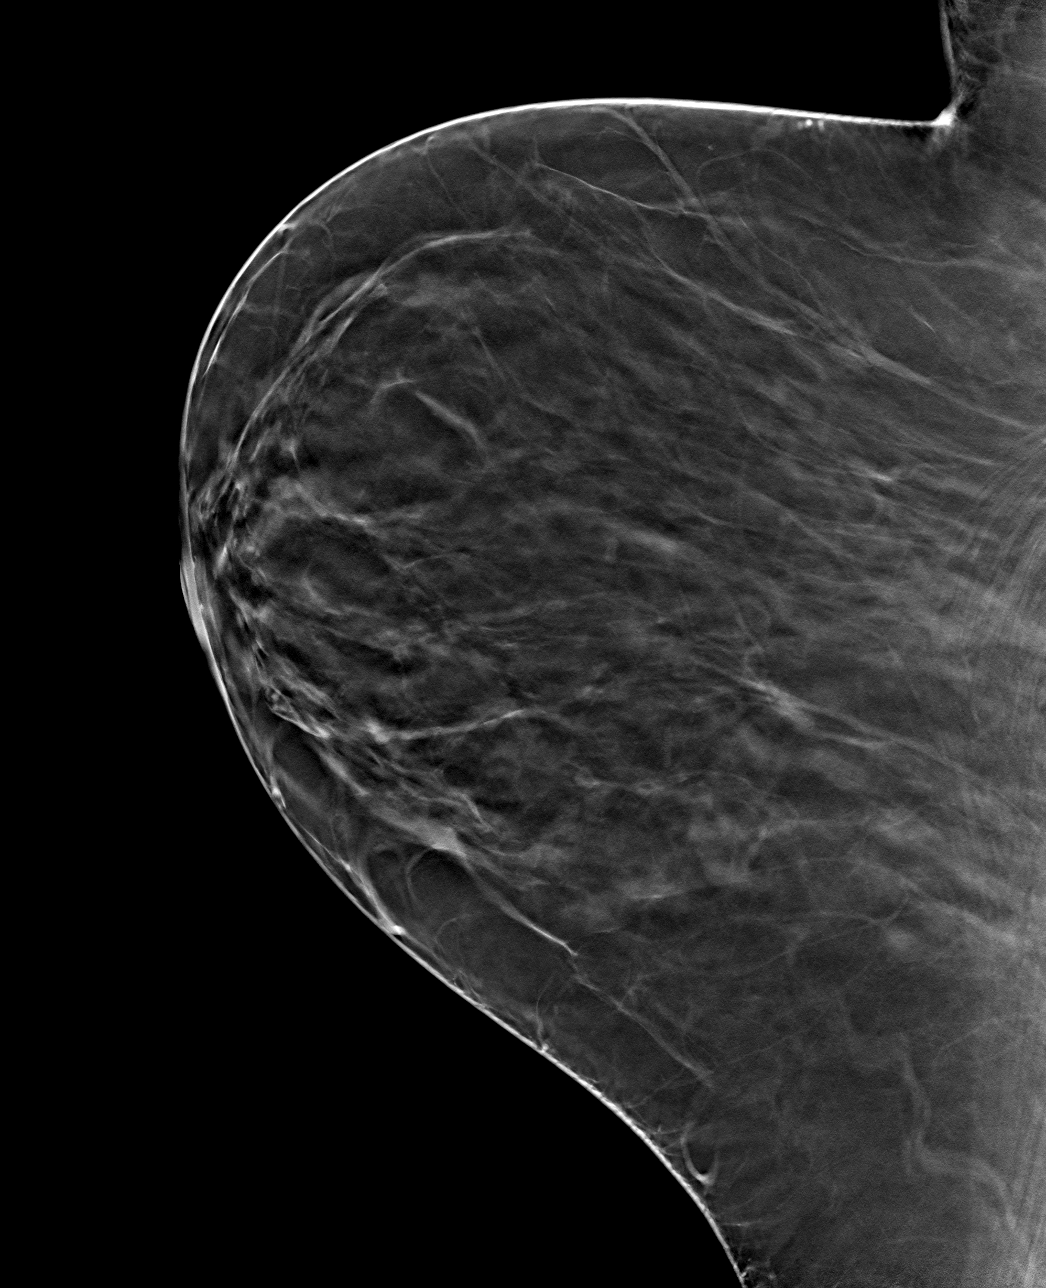

[4 of 12 positions shown; findings below may reference images not displayed]

ACR Breast Density Category b: There are scattered areas of
fibroglandular density.
FINDINGS: 3D tomographic images of the left breast demonstrate normal
appearing fibroglandular tissue at the location of the recently
suspected mass. No findings suspicious for malignancy in the breast.

Mammographic images were processed with CAD.
IMPRESSION: No evidence of malignancy. The recently suspected left breast mass
represented overlapping of normal fibroglandular tissue.

RECOMMENDATION:
Bilateral screening mammogram in 1 year.

I have discussed the findings and recommendations with the patient.
Results were also provided in writing at the conclusion of the
visit. If applicable, a reminder letter will be sent to the patient
regarding the next appointment.

BI-RADS CATEGORY  1: Negative.

## 2016-02-13 ENCOUNTER — Ambulatory Visit: Payer: Self-pay

## 2016-02-13 ENCOUNTER — Other Ambulatory Visit: Payer: Self-pay

## 2019-07-05 ENCOUNTER — Encounter: Payer: Self-pay | Admitting: Internal Medicine

## 2022-08-22 DIAGNOSIS — K219 Gastro-esophageal reflux disease without esophagitis: Secondary | ICD-10-CM | POA: Diagnosis not present

## 2022-08-22 DIAGNOSIS — E785 Hyperlipidemia, unspecified: Secondary | ICD-10-CM | POA: Diagnosis not present

## 2022-08-22 DIAGNOSIS — H269 Unspecified cataract: Secondary | ICD-10-CM | POA: Diagnosis not present

## 2022-08-22 DIAGNOSIS — I10 Essential (primary) hypertension: Secondary | ICD-10-CM | POA: Diagnosis not present

## 2022-08-22 DIAGNOSIS — H04123 Dry eye syndrome of bilateral lacrimal glands: Secondary | ICD-10-CM | POA: Diagnosis not present

## 2022-08-22 DIAGNOSIS — M62838 Other muscle spasm: Secondary | ICD-10-CM | POA: Diagnosis not present

## 2022-08-22 DIAGNOSIS — E039 Hypothyroidism, unspecified: Secondary | ICD-10-CM | POA: Diagnosis not present

## 2022-08-22 DIAGNOSIS — E1169 Type 2 diabetes mellitus with other specified complication: Secondary | ICD-10-CM | POA: Diagnosis not present

## 2022-08-22 DIAGNOSIS — M25562 Pain in left knee: Secondary | ICD-10-CM | POA: Diagnosis not present

## 2022-09-18 DIAGNOSIS — Z419 Encounter for procedure for purposes other than remedying health state, unspecified: Secondary | ICD-10-CM | POA: Diagnosis not present

## 2022-11-19 DIAGNOSIS — H04123 Dry eye syndrome of bilateral lacrimal glands: Secondary | ICD-10-CM | POA: Diagnosis not present

## 2022-11-19 DIAGNOSIS — H353130 Nonexudative age-related macular degeneration, bilateral, stage unspecified: Secondary | ICD-10-CM | POA: Diagnosis not present

## 2022-11-19 DIAGNOSIS — Z961 Presence of intraocular lens: Secondary | ICD-10-CM | POA: Diagnosis not present

## 2022-11-19 DIAGNOSIS — H43812 Vitreous degeneration, left eye: Secondary | ICD-10-CM | POA: Diagnosis not present

## 2022-11-26 DIAGNOSIS — E7849 Other hyperlipidemia: Secondary | ICD-10-CM | POA: Diagnosis not present

## 2022-11-26 DIAGNOSIS — E038 Other specified hypothyroidism: Secondary | ICD-10-CM | POA: Diagnosis not present

## 2022-11-26 DIAGNOSIS — E1142 Type 2 diabetes mellitus with diabetic polyneuropathy: Secondary | ICD-10-CM | POA: Diagnosis not present

## 2022-11-26 DIAGNOSIS — I10 Essential (primary) hypertension: Secondary | ICD-10-CM | POA: Diagnosis not present

## 2022-11-26 DIAGNOSIS — K5900 Constipation, unspecified: Secondary | ICD-10-CM | POA: Diagnosis not present

## 2023-02-11 DIAGNOSIS — E1142 Type 2 diabetes mellitus with diabetic polyneuropathy: Secondary | ICD-10-CM | POA: Diagnosis not present

## 2023-02-11 DIAGNOSIS — Z0120 Encounter for dental examination and cleaning without abnormal findings: Secondary | ICD-10-CM | POA: Diagnosis not present

## 2023-02-11 DIAGNOSIS — M25511 Pain in right shoulder: Secondary | ICD-10-CM | POA: Diagnosis not present

## 2023-02-11 DIAGNOSIS — I10 Essential (primary) hypertension: Secondary | ICD-10-CM | POA: Diagnosis not present

## 2023-02-11 DIAGNOSIS — E7849 Other hyperlipidemia: Secondary | ICD-10-CM | POA: Diagnosis not present

## 2023-02-11 DIAGNOSIS — E038 Other specified hypothyroidism: Secondary | ICD-10-CM | POA: Diagnosis not present

## 2023-06-07 ENCOUNTER — Other Ambulatory Visit: Payer: Self-pay | Admitting: Internal Medicine

## 2023-06-07 DIAGNOSIS — Z1231 Encounter for screening mammogram for malignant neoplasm of breast: Secondary | ICD-10-CM

## 2023-07-08 ENCOUNTER — Telehealth: Payer: Self-pay | Admitting: Gastroenterology

## 2023-07-08 ENCOUNTER — Ambulatory Visit
Admission: RE | Admit: 2023-07-08 | Discharge: 2023-07-08 | Disposition: A | Discharge status: Home / Self Care | Payer: Self-pay | Source: Ambulatory Visit | Attending: Internal Medicine | Admitting: Internal Medicine

## 2023-07-08 DIAGNOSIS — Z1231 Encounter for screening mammogram for malignant neoplasm of breast: Secondary | ICD-10-CM

## 2023-07-08 NOTE — Telephone Encounter (Signed)
Hi Dr. Barron Alvine,   Supervising Provider PM 07/08/23   We received a referral for patient to have a colonoscopy. Patient last had a colonoscopy in 2016 with Dr. Jena Gauss at [facility]. Patient is requesting a transfer of care due to patient now living in [place]. Patient's records are in EP[facility] in procedures tab for you to review and advise on scheduling.    Thank you.

## 2023-07-16 NOTE — Telephone Encounter (Signed)
Called patient's son to schedule. Stated he will have someone give a call back to schedule.

## 2023-07-23 ENCOUNTER — Encounter: Payer: Self-pay | Admitting: Gastroenterology

## 2023-07-29 ENCOUNTER — Ambulatory Visit: Payer: Self-pay | Admitting: Physician Assistant

## 2023-08-24 ENCOUNTER — Encounter: Payer: Self-pay | Admitting: Gastroenterology

## 2023-11-17 ENCOUNTER — Ambulatory Visit (AMBULATORY_SURGERY_CENTER)

## 2023-11-17 ENCOUNTER — Telehealth: Payer: Self-pay

## 2023-11-17 VITALS — Ht 63.0 in | Wt 207.6 lb

## 2023-11-17 DIAGNOSIS — Z860100 Personal history of colon polyps, unspecified: Secondary | ICD-10-CM

## 2023-11-17 NOTE — Telephone Encounter (Signed)
 While going through pre-visit appointment with this patient, the son who was present with the patient stated that they understood the upcoming procedure appointment was going to be an EGD d/t abdominal pain patient has been having since the end of 2024. Son states that patient had issues with insurance, which delayed the scheduling of procedure.  RN communicated that since patient's last colonoscopy was in 2013 with adenomatous polyp removal, patient is due for her next colonoscopy, but understandably, patient is desiring to have an EGD d/t her pain. RN told patient and son that she will reach out to Dr. Karene Oto with this information and that patient will likely need office visit prior to scheduling both a colonoscopy and EGD on the same day.  Patient and son were very understanding, but also disappointed about more delay with the abdominal pain patient continues to experience. They are hopeful that patient can have procedure ASAP.  RN will cancel currently scheduled colonoscopy.

## 2023-11-17 NOTE — Progress Notes (Signed)
 No egg or soy allergy known to patient  No issues known to pt with past sedation with any surgeries or procedures Patient denies ever being told they had issues or difficulty with intubation  No FH of Malignant Hyperthermia Pt is not on diet pills but IS on GLP-1 medication Pt is not on  home 02  Pt is not on blood thinners  Pt states occasional issues with chronic constipation  No A fib or A flutter Have any cardiac testing pending--no Ambulates independently Interpreter present for pre-visit

## 2023-12-01 ENCOUNTER — Encounter: Payer: Self-pay | Admitting: Gastroenterology

## 2023-12-01 ENCOUNTER — Ambulatory Visit (INDEPENDENT_AMBULATORY_CARE_PROVIDER_SITE_OTHER): Admitting: Gastroenterology

## 2023-12-01 ENCOUNTER — Other Ambulatory Visit: Payer: Self-pay

## 2023-12-01 DIAGNOSIS — R140 Abdominal distension (gaseous): Secondary | ICD-10-CM | POA: Diagnosis not present

## 2023-12-01 DIAGNOSIS — E119 Type 2 diabetes mellitus without complications: Secondary | ICD-10-CM

## 2023-12-01 DIAGNOSIS — Z860100 Personal history of colon polyps, unspecified: Secondary | ICD-10-CM

## 2023-12-01 DIAGNOSIS — Z7985 Long-term (current) use of injectable non-insulin antidiabetic drugs: Secondary | ICD-10-CM

## 2023-12-01 DIAGNOSIS — R630 Anorexia: Secondary | ICD-10-CM

## 2023-12-01 DIAGNOSIS — K5900 Constipation, unspecified: Secondary | ICD-10-CM

## 2023-12-01 DIAGNOSIS — R1084 Generalized abdominal pain: Secondary | ICD-10-CM

## 2023-12-01 DIAGNOSIS — R6881 Early satiety: Secondary | ICD-10-CM

## 2023-12-01 MED ORDER — NA SULFATE-K SULFATE-MG SULF 17.5-3.13-1.6 GM/177ML PO SOLN
1.0000 | ORAL | 0 refills | Status: AC
  Filled 2023-12-01: qty 354, 2d supply, fill #0
  Filled 2024-01-05: qty 354, 1d supply, fill #0

## 2023-12-01 NOTE — Patient Instructions (Signed)
 _______________________________________________________  If your blood pressure at your visit was 140/90 or greater, please contact your primary care physician to follow up on this. ______________________________________________________  If you are age 69 or older, your body mass index should be between 23-30. Your Body mass index is 36.67 kg/m. If this is out of the aforementioned range listed, please consider follow up with your Primary Care Provider. ________________________________________________________  The  GI providers would like to encourage you to use MYCHART to communicate with providers for non-urgent requests or questions.  Due to long hold times on the telephone, sending your provider a message by [facility] may be a faster and more efficient way to get a response.  Please allow 48 business hours for a response.  Please remember that this is for non-urgent requests.  _______________________________________________________  Elene Griffes have been scheduled for an endoscopy and colonoscopy. Please follow the written instructions given to you at your visit today.  If you use inhalers (even only as needed), please bring them with you on the day of your procedure.  DO NOT TAKE 7 DAYS PRIOR TO TEST- Trulicity (dulaglutide) Ozempic, Wegovy (semaglutide) Mounjaro (tirzepatide) Bydureon Bcise (exanatide extended release)  DO NOT TAKE 1 DAY PRIOR TO YOUR TEST Rybelsus (semaglutide) Adlyxin (lixisenatide) Victoza (liraglutide) Byetta (exanatide) ___________________________________________________________________________  Due to recent changes in healthcare laws, you may see the results of your imaging and laboratory studies on MyChart before your provider has had a chance to review them.  We understand that in some cases there may be results that are confusing or concerning to you. Not all laboratory results come back in the same time frame and the provider may be waiting for multiple  results in order to interpret others.  Please give us  48 hours in order for your provider to thoroughly review all the results before contacting the office for clarification of your results.   It was a pleasure to see you today!  Vito Cirigliano, D.O.

## 2023-12-01 NOTE — Progress Notes (Signed)
 Chief Complaint: Abdominal pain, bloating, history of colon polyps   Referring Provider:     Charle Congo, MD    HPI:     Katie Rhodes is a 68 y.o. female referred to the [facility] for evaluation of abdominal pain and fullness, and discuss repeat colonoscopy for ongoing polyp surveillance.  History obtained via family member in the room.  Patient reports decreased appetite, increased belching, abdominal bloating, and sense of fullness.  Symptoms have been on/off for the last 2-3 months.  Does have postprandial nausea, but no emesis.  No hematochezia or melena.  Weight stable, no fevers.  No dysphagia, odynophagia.  No reflux symptoms.    She does have constipation for the last 4-5 months.  PCP started her on Linzess 2-3 months ago.  Patient has been taking on demand, but does report feeling better when she takes it (both constipation and abdominal fullness/bloating improve when she takes Linzess).  Not entirely clear why she does not take daily given good therapeutic response.  She was started on Ozempic about 1 year ago.  Also prescribed pantoprazole  by PCP recently, but she is without reflux symptoms.  Finally, she is due for repeat colonoscopy for ongoing polyp surveillance.  Endoscopic History: - 10/27/2011: Colonoscopy (Dr. Nickey Barn): 3 mm cecal adenoma, two 3 mm descending colon adenomas, medium size internal hemorrhoids - 08/08/2014: Colonoscopy (Dr. Riley Cheadle): pancolonic diverticulosis, 4 mm pedunculated adenoma removed from descending colon.   No known family history of CRC, GI malignancy, liver disease, pancreatic disease, or IBD.     Past Medical History:  Diagnosis Date   Diabetes mellitus    GERD (gastroesophageal reflux disease)    Hypercholesteremia    Hypertension    Thyroid disease      Past Surgical History:  Procedure Laterality Date   CHOLECYSTECTOMY     COLONOSCOPY  2013   polypectomy x 3; repeat 3-5 years.   COLONOSCOPY N/A  08/08/2014   Procedure: COLONOSCOPY;  Surgeon: Suzette Espy, MD;  [facility]: [facility];  Service: Endoscopy;  Laterality: N/A;  830am   THYROIDECTOMY     Family History  Problem Relation Age of Onset   Thyroid disease Mother    Colon cancer Neg Hx    BRCA 1/2 Neg Hx    Breast cancer Neg Hx    Esophageal cancer Neg Hx    Rectal cancer Neg Hx    Stomach cancer Neg Hx    Colon polyps Neg Hx    Social History   Tobacco Use   Smoking status: Never  Vaping Use   Vaping status: Never Used  Substance Use Topics   Alcohol use: No   Drug use: No   Current Outpatient Medications  Medication Sig Dispense Refill   albuterol  (PROVENTIL  HFA;VENTOLIN  HFA) 108 (90 BASE) MCG/ACT inhaler Inhale 2 puffs into the lungs every 4 (four) hours as needed for wheezing or shortness of breath. (Patient not taking: Reported on 08/28/2014) 1 Inhaler 0   aspirin  81 MG tablet Take 1 tablet (81 mg total) by mouth daily. 30 tablet 3   bisacodyl (DULCOLAX) 5 MG EC tablet Take 5 mg by mouth 2 (two) times daily as needed. For constipation  (Patient not taking: Reported on 11/17/2023)     Blood Glucose Monitoring Suppl (BLOOD GLUCOSE METER) kit Use as instructed 1 each 0   Calcium Carbonate-Vitamin D  (CALCIUM 500+D PO) Take 1 tablet by mouth daily.  Cholecalciferol (VITAMIN D -3 PO) Take 1 tablet by mouth daily.  (Patient not taking: Reported on 11/17/2023)     Comfort Lancets MISC Comfort Lancets Miscellaneous QTY: 100 each Days: 30 Refills: 3  Written: 10/23/20 Patient Instructions: blood sugar check daily     cyclobenzaprine  (FLEXERIL ) 10 MG tablet Take 1 tablet (10 mg total) by mouth 3 (three) times daily as needed for muscle spasms. (Patient not taking: Reported on 12/01/2023) 60 tablet 2   diclofenac  Sodium (VOLTAREN ) 1 % GEL APPLY 4 GRAMS TOPICALLY TO THE AFFECTED AREA FOUR TIMES DAILY AS NEEDED FOR PAIN     Flaxseed, Linseed, (FLAX SEED OIL PO) Take 1,000 mg by mouth daily.       gabapentin  (NEURONTIN ) 300  MG capsule Take 1 capsule (300 mg total) by mouth 3 (three) times daily. (Patient not taking: Reported on 11/17/2023) 180 capsule 3   Glucosamine-Chondroit-Vit C-Mn (GLUCOSAMINE CHONDROITIN COMPLX) CAPS Take 1 capsule by mouth daily.   (Patient not taking: Reported on 11/17/2023)     glucose blood test strip Use as instructed 100 each 12   hydrochlorothiazide  (HYDRODIURIL ) 25 MG tablet Take 1 tablet (25 mg total) by mouth daily. (Patient not taking: Reported on 12/01/2023) 90 tablet 3   levothyroxine  (SYNTHROID , LEVOTHROID) 150 MCG tablet Take 1 tablet (150 mcg total) by mouth daily. (Patient taking differently: Take 112 mcg by mouth daily.) 90 tablet 3   LINZESS 145 MCG CAPS capsule Take 145 mcg by mouth every morning.     losartan  (COZAAR ) 50 MG tablet Take 1 tablet (50 mg total) by mouth daily. 90 tablet 3   Magnesium 400 MG TABS 400 mg daily.     metFORMIN  (GLUCOPHAGE ) 500 MG tablet Take 1 tablet (500 mg total) by mouth 2 (two) times daily with a meal. 180 tablet 3   metoprolol tartrate (LOPRESSOR) 50 MG tablet Take 1 tablet by mouth 2 (two) times daily.     Multiple Vitamins-Minerals (CENTRUM SILVER ADULT 50+) TABS Centrum Silver Adult 50+ Oral Tablet QTY: 1 tablet Days: 0 Refills: 0  Written: 07/26/19 Patient Instructions:     omeprazole  (PRILOSEC) 20 MG capsule Take 1 capsule (20 mg total) by mouth daily. (Patient not taking: Reported on 11/17/2023) 90 capsule 3   OZEMPIC, 2 MG/DOSE, 8 MG/3ML SOPN Inject 2 mg into the skin once a week.     pantoprazole  (PROTONIX ) 20 MG tablet Take 1 tablet by mouth daily.     simvastatin  (ZOCOR ) 20 MG tablet Take 1 tablet (20 mg total) by mouth daily. 90 tablet 3   traMADol  (ULTRAM ) 50 MG tablet Take 1 tablet (50 mg total) by mouth daily as needed for moderate pain. (Patient not taking: Reported on 12/01/2023) 30 tablet 0   No current facility-administered medications for this visit.   No Known Allergies   Review of Systems: All systems reviewed and negative  except where noted in HPI.     Physical Exam:    Wt Readings from Last 3 Encounters:  12/01/23 207 lb (93.9 kg)  11/17/23 207 lb 9.6 oz (94.2 kg)  08/28/14 219 lb (99.3 kg)    BP 122/68   Pulse 67   Ht 5\' 3"  (1.6 m)   Wt 207 lb (93.9 kg)   BMI 36.67 kg/m  Constitutional:  Pleasant, in no acute distress. Psychiatric: Normal mood and affect. Behavior is normal. Cardiovascular: Normal rate, regular rhythm. No edema Pulmonary/chest: Effort normal and breath sounds normal. No wheezing, rales or rhonchi. Abdominal: Soft, nondistended, nontender. Bowel  sounds active throughout. There are no masses palpable. No hepatomegaly. Neurological: Alert and oriented to person place and time. Skin: Skin is warm and dry. No rashes noted.   ASSESSMENT AND PLAN;   1) History of colon polyps - Schedule colonoscopy  2) Constipation 3) Abdominal bloating - Recommended that she start taking Linzess daily and evaluate for clinical response - Recommend drinking at least 64 ounces of water  daily  4) Decreased appetite 5) Early satiety 6) Generalized abdominal pain - EGD to evaluate for PUD, gastritis, etc. - Was recently started on pantoprazole .  Unclear as she needs to continue this, but will evaluate for UGI pathology with upper endoscopy and discontinue if otherwise normal  7) Diabetes - Hold Ozempic 7 days prior to bowel prep  The indications, risks, and benefits of EGD and colonoscopy were explained to the patient and family member at bedside in detail. Risks include but are not limited to bleeding, perforation, adverse reaction to medications, and cardiopulmonary compromise. Sequelae include but are not limited to the possibility of surgery, hospitalization, and mortality. The patient verbalized understanding and wished to proceed. All questions answered, referred to scheduler and bowel prep ordered. Further recommendations pending results of the exam.     Annis Kinder, DO, FACG   12/01/2023, 3:15 PM   Charle Congo, MD

## 2023-12-10 ENCOUNTER — Other Ambulatory Visit: Payer: Self-pay

## 2024-01-05 ENCOUNTER — Other Ambulatory Visit: Payer: Self-pay

## 2024-01-10 ENCOUNTER — Ambulatory Visit: Admitting: Physical Therapy

## 2024-01-12 ENCOUNTER — Ambulatory Visit: Admitting: Gastroenterology

## 2024-01-12 ENCOUNTER — Encounter: Payer: Self-pay | Admitting: Gastroenterology

## 2024-01-12 VITALS — BP 125/74 | HR 62 | Temp 98.1°F | Resp 20 | Ht 63.0 in | Wt 207.0 lb

## 2024-01-12 DIAGNOSIS — Z860100 Personal history of colon polyps, unspecified: Secondary | ICD-10-CM

## 2024-01-12 DIAGNOSIS — K5730 Diverticulosis of large intestine without perforation or abscess without bleeding: Secondary | ICD-10-CM

## 2024-01-12 DIAGNOSIS — K641 Second degree hemorrhoids: Secondary | ICD-10-CM

## 2024-01-12 DIAGNOSIS — K2970 Gastritis, unspecified, without bleeding: Secondary | ICD-10-CM

## 2024-01-12 DIAGNOSIS — K2950 Unspecified chronic gastritis without bleeding: Secondary | ICD-10-CM

## 2024-01-12 DIAGNOSIS — R1084 Generalized abdominal pain: Secondary | ICD-10-CM

## 2024-01-12 DIAGNOSIS — Z1211 Encounter for screening for malignant neoplasm of colon: Secondary | ICD-10-CM

## 2024-01-12 DIAGNOSIS — D122 Benign neoplasm of ascending colon: Secondary | ICD-10-CM | POA: Diagnosis not present

## 2024-01-12 DIAGNOSIS — R140 Abdominal distension (gaseous): Secondary | ICD-10-CM

## 2024-01-12 DIAGNOSIS — K5900 Constipation, unspecified: Secondary | ICD-10-CM

## 2024-01-12 DIAGNOSIS — B9681 Helicobacter pylori [H. pylori] as the cause of diseases classified elsewhere: Secondary | ICD-10-CM | POA: Diagnosis not present

## 2024-01-12 MED ORDER — SODIUM CHLORIDE 0.9 % IV SOLN: 500.0000 mL | Freq: Once | INTRAVENOUS | Status: DC

## 2024-01-12 MED ORDER — PANTOPRAZOLE SODIUM 40 MG PO TBEC: DELAYED_RELEASE_TABLET | ORAL | 4 refills | Status: DC

## 2024-01-12 NOTE — Progress Notes (Signed)
 GASTROENTEROLOGY PROCEDURE H&P NOTE   Primary Care Physician: Shelda Atlas, MD    Reason for Procedure:   Abdominal pain, bloating, colon polyp surveillance, constipation, nausea, early satiety  Plan:    EGD, colonoscopy  Patient is appropriate for endoscopic procedure(s) in the ambulatory ([facility]) setting.  The nature of the procedure, as well as the risks, benefits, and alternatives were carefully and thoroughly reviewed with the patient. Ample time for discussion and questions allowed. The patient understood, was satisfied, and agreed to proceed.     HPI: Katie Rhodes is a 68 y.o. female who presents for EGD and colonoscopy for evaluation of multiple GI symptoms, including decreased appetite, increased belching, postprandial nausea without emesis.  No improvement with trial of PPI.  Separately, history of constipation and recently started on Linzess by PCP with response.  Currently taking Ozempic for about a year; has been holding for 7 days.   Also with history of colon polyps and due for ongoing surveillance.  Endoscopic History: - 10/27/2011: Colonoscopy (Dr. Rollin): 3 mm cecal adenoma, two 3 mm descending colon adenomas, medium size internal hemorrhoids - 08/08/2014: Colonoscopy (Dr. Shaaron): pancolonic diverticulosis, 4 mm pedunculated adenoma removed from descending colon.   Past Medical History:  Diagnosis Date   Diabetes mellitus    GERD (gastroesophageal reflux disease)    Hypercholesteremia    Hypertension    Thyroid disease     Past Surgical History:  Procedure Laterality Date   CHOLECYSTECTOMY     COLONOSCOPY  2013   polypectomy x 3; repeat 3-5 years.   COLONOSCOPY N/A 08/08/2014   Procedure: COLONOSCOPY;  Surgeon: Lamar CHRISTELLA Shaaron, MD;  Location: [facility];  Service: Endoscopy;  Laterality: N/A;  830am   THYROIDECTOMY      Prior to Admission medications   Medication Sig Start Date End Date Taking? Authorizing Provider  aspirin  81 MG tablet Take 1 tablet  (81 mg total) by mouth daily. 06/12/13  Yes Advani, Deepak, MD  bisacodyl (DULCOLAX) 5 MG EC tablet Take 5 mg by mouth 2 (two) times daily as needed. For constipation   Yes [provider]  Calcium Carbonate-Vitamin D  (CALCIUM 500+D PO) Take 1 tablet by mouth daily.    Yes [provider]  Cholecalciferol (VITAMIN D -3 PO) Take 1 tablet by mouth daily.   Yes [provider]  Flaxseed, Linseed, (FLAX SEED OIL PO) Take 1,000 mg by mouth daily.     Yes [provider]  glucose blood test strip Use as instructed 08/28/14  Yes Jegede, Olugbemiga E, MD  levothyroxine  (SYNTHROID , LEVOTHROID) 150 MCG tablet Take 1 tablet (150 mcg total) by mouth daily. Patient taking differently: Take 112 mcg by mouth daily. 05/18/14  Yes Jegede, Olugbemiga E, MD  LINZESS 145 MCG CAPS capsule Take 145 mcg by mouth every morning.   Yes [provider]  losartan  (COZAAR ) 50 MG tablet Take 1 tablet (50 mg total) by mouth daily. 08/28/14  Yes Jegede, Olugbemiga E, MD  Magnesium 400 MG TABS 400 mg daily.   Yes [provider]  metoprolol tartrate (LOPRESSOR) 50 MG tablet Take 1 tablet by mouth 2 (two) times daily.   Yes [provider]  Multiple Vitamins-Minerals (CENTRUM SILVER ADULT 50+) TABS Centrum Silver Adult 50+ Oral Tablet QTY: 1 tablet Days: 0 Refills: 0  Written: 07/26/19 Patient Instructions:   Yes [provider]  Na Sulfate-K Sulfate-Mg Sulfate concentrate (SUPREP BOWEL PREP KIT) 17.5-3.13-1.6 GM/177ML SOLN Take 1 kit (354 mLs total) by  mouth as directed. 12/01/23  Yes Jahson Emanuele V, DO  omeprazole  (PRILOSEC) 20 MG capsule Take 1 capsule (20 mg total) by mouth daily. 08/28/14  Yes Jegede, Olugbemiga E, MD  simvastatin  (ZOCOR ) 20 MG tablet Take 1 tablet (20 mg total) by mouth daily. 08/28/14  Yes Jegede, Olugbemiga E, MD  albuterol  (PROVENTIL  HFA;VENTOLIN  HFA) 108 (90 BASE) MCG/ACT inhaler Inhale 2 puffs into the lungs every 4 (four) hours as needed  for wheezing or shortness of breath. Patient not taking: Reported on 08/28/2014 06/17/14   Richad Jon HERO, NP  Blood Glucose Monitoring Suppl (BLOOD GLUCOSE METER) kit Use as instructed 07/18/13   Jegede, Olugbemiga E, MD  Comfort Lancets MISC Comfort Lancets Miscellaneous QTY: 100 each Days: 30 Refills: 3  Written: 10/23/20 Patient Instructions: blood sugar check daily    [provider]  cyclobenzaprine  (FLEXERIL ) 10 MG tablet Take 1 tablet (10 mg total) by mouth 3 (three) times daily as needed for muscle spasms. Patient not taking: Reported on 12/01/2023 11/20/13   Jegede, Olugbemiga E, MD  diclofenac  Sodium (VOLTAREN ) 1 % GEL APPLY 4 GRAMS TOPICALLY TO THE AFFECTED AREA FOUR TIMES DAILY AS NEEDED FOR PAIN    [provider]  gabapentin  (NEURONTIN ) 300 MG capsule Take 1 capsule (300 mg total) by mouth 3 (three) times daily. Patient not taking: Reported on 11/17/2023 05/14/14   Jegede, Olugbemiga E, MD  Glucosamine-Chondroit-Vit C-Mn (GLUCOSAMINE CHONDROITIN COMPLX) CAPS Take 1 capsule by mouth daily.   Patient not taking: Reported on 11/17/2023    [provider]  hydrochlorothiazide  (HYDRODIURIL ) 25 MG tablet Take 1 tablet (25 mg total) by mouth daily. Patient not taking: Reported on 11/17/2023 08/28/14   Jegede, Olugbemiga E, MD  metFORMIN  (GLUCOPHAGE ) 500 MG tablet Take 1 tablet (500 mg total) by mouth 2 (two) times daily with a meal. 08/28/14   Jegede, Olugbemiga E, MD  OZEMPIC, 2 MG/DOSE, 8 MG/3ML SOPN Inject 2 mg into the skin once a week.    [provider]  pantoprazole  (PROTONIX ) 20 MG tablet Take 1 tablet by mouth daily. Patient not taking: Reported on 01/12/2024    [provider]  RESTASIS 0.05 % ophthalmic emulsion Place 1 drop into both eyes 2 (two) times daily.    [provider]  traMADol  (ULTRAM ) 50 MG tablet Take 1 tablet (50 mg total) by mouth daily as needed for moderate pain. Patient not taking: Reported on 11/17/2023 08/28/14    Jegede, Olugbemiga E, MD    Current Outpatient Medications  Medication Sig Dispense Refill   aspirin  81 MG tablet Take 1 tablet (81 mg total) by mouth daily. 30 tablet 3   bisacodyl (DULCOLAX) 5 MG EC tablet Take 5 mg by mouth 2 (two) times daily as needed. For constipation     Calcium Carbonate-Vitamin D  (CALCIUM 500+D PO) Take 1 tablet by mouth daily.      Cholecalciferol (VITAMIN D -3 PO) Take 1 tablet by mouth daily.     Flaxseed, Linseed, (FLAX SEED OIL PO) Take 1,000 mg by mouth daily.       glucose blood test strip Use as instructed 100 each 12   levothyroxine  (SYNTHROID , LEVOTHROID) 150 MCG tablet Take 1 tablet (150 mcg total) by mouth daily. (Patient taking differently: Take 112 mcg by mouth daily.) 90 tablet 3   LINZESS 145 MCG CAPS capsule Take 145 mcg by mouth every morning.     losartan  (COZAAR ) 50 MG tablet Take 1 tablet (50 mg total) by mouth daily. 90 tablet  3   Magnesium 400 MG TABS 400 mg daily.     metoprolol tartrate (LOPRESSOR) 50 MG tablet Take 1 tablet by mouth 2 (two) times daily.     Multiple Vitamins-Minerals (CENTRUM SILVER ADULT 50+) TABS Centrum Silver Adult 50+ Oral Tablet QTY: 1 tablet Days: 0 Refills: 0  Written: 07/26/19 Patient Instructions:     Na Sulfate-K Sulfate-Mg Sulfate concentrate (SUPREP BOWEL PREP KIT) 17.5-3.13-1.6 GM/177ML SOLN Take 1 kit (354 mLs total) by mouth as directed. 354 mL 0   omeprazole  (PRILOSEC) 20 MG capsule Take 1 capsule (20 mg total) by mouth daily. 90 capsule 3   simvastatin  (ZOCOR ) 20 MG tablet Take 1 tablet (20 mg total) by mouth daily. 90 tablet 3   albuterol  (PROVENTIL  HFA;VENTOLIN  HFA) 108 (90 BASE) MCG/ACT inhaler Inhale 2 puffs into the lungs every 4 (four) hours as needed for wheezing or shortness of breath. (Patient not taking: Reported on 08/28/2014) 1 Inhaler 0   Blood Glucose Monitoring Suppl (BLOOD GLUCOSE METER) kit Use as instructed 1 each 0   Comfort Lancets MISC Comfort Lancets Miscellaneous QTY: 100 each Days: 30  Refills: 3  Written: 10/23/20 Patient Instructions: blood sugar check daily     cyclobenzaprine  (FLEXERIL ) 10 MG tablet Take 1 tablet (10 mg total) by mouth 3 (three) times daily as needed for muscle spasms. (Patient not taking: Reported on 12/01/2023) 60 tablet 2   diclofenac  Sodium (VOLTAREN ) 1 % GEL APPLY 4 GRAMS TOPICALLY TO THE AFFECTED AREA FOUR TIMES DAILY AS NEEDED FOR PAIN     gabapentin  (NEURONTIN ) 300 MG capsule Take 1 capsule (300 mg total) by mouth 3 (three) times daily. (Patient not taking: Reported on 11/17/2023) 180 capsule 3   Glucosamine-Chondroit-Vit C-Mn (GLUCOSAMINE CHONDROITIN COMPLX) CAPS Take 1 capsule by mouth daily.   (Patient not taking: Reported on 11/17/2023)     hydrochlorothiazide  (HYDRODIURIL ) 25 MG tablet Take 1 tablet (25 mg total) by mouth daily. (Patient not taking: Reported on 11/17/2023) 90 tablet 3   metFORMIN  (GLUCOPHAGE ) 500 MG tablet Take 1 tablet (500 mg total) by mouth 2 (two) times daily with a meal. 180 tablet 3   OZEMPIC, 2 MG/DOSE, 8 MG/3ML SOPN Inject 2 mg into the skin once a week.     pantoprazole  (PROTONIX ) 20 MG tablet Take 1 tablet by mouth daily. (Patient not taking: Reported on 01/12/2024)     RESTASIS 0.05 % ophthalmic emulsion Place 1 drop into both eyes 2 (two) times daily.     traMADol  (ULTRAM ) 50 MG tablet Take 1 tablet (50 mg total) by mouth daily as needed for moderate pain. (Patient not taking: Reported on 11/17/2023) 30 tablet 0   Current Facility-Administered Medications  Medication Dose Route Frequency Provider Last Rate Last Admin   0.9 %  sodium chloride  infusion  500 mL Intravenous Once Deckard Stuber V, DO        Allergies as of 01/12/2024   (No Known Allergies)    Family History  Problem Relation Age of Onset   Thyroid disease Mother    Colon cancer Neg Hx    BRCA 1/2 Neg Hx    Breast cancer Neg Hx    Esophageal cancer Neg Hx    Rectal cancer Neg Hx    Stomach cancer Neg Hx    Colon polyps Neg Hx     Social History    Socioeconomic History   Marital status: Married    Spouse name: Not on file   Number of children: Not on file  Years of education: Not on file   Highest education level: Not on file  Occupational History   Not on file  Tobacco Use   Smoking status: Never   Smokeless tobacco: Not on file  Vaping Use   Vaping status: Never Used  Substance and Sexual Activity   Alcohol use: No   Drug use: No   Sexual activity: Not on file  Other Topics Concern   Not on file  Social History Narrative   Not on file   Social Drivers of Health   Financial Resource Strain: Low Risk  (06/27/2021)   Received from [facility]   Overall Financial Resource Strain (CARDIA)    Difficulty of Paying Living Expenses: Not hard at all  Food Insecurity: No Food Insecurity (06/27/2021)   Received from [facility]   Hunger Vital Sign    Within the past 12 months, you worried that your food would run out before you got the money to buy more.: Never true    Within the past 12 months, the food you bought just didn't last and you didn't have money to get more.: Never true  Transportation Needs: No Transportation Needs (06/27/2021)   Received from [facility]    Lack of Transportation (Medical): No    Lack of Transportation (Non-Medical): No  Physical Activity: Not on file  Stress: Not on file  Social Connections: Not on file  Intimate Partner Violence: Unknown (04/20/2021)   Received from [facility]   Intimate Partner Violence    Feels physically and emotionally safe: Not on file    Fear of partner: Not on file    Physical Exam: Vital signs in last 24 hours: @BP  (!) 159/83   Pulse 65   Temp 98.1 F (36.7 C) (Temporal)   Ht 5' 3 (1.6 m)   Wt 207 lb (93.9 kg)   SpO2 100%   BMI 36.67 kg/m  GEN: NAD EYE: Sclerae anicteric ENT: MMM CV: Non-tachycardic Pulm: CTA b/l GI: Soft, NT/ND NEURO:  Alert & Oriented x 3   Sandor Flatter, DO [place]  [facility]   01/12/2024 2:10 PM

## 2024-01-12 NOTE — Op Note (Signed)
  [facility] Patient Name: Katie Rhodes Procedure Date: 01/12/2024 2:22 PM MRN: [id number] Endoscopist: Sandor Flatter , MD, [id number] Age: 68 Referring MD:  Date of Birth: 04-13-1956 Gender: Female Account #: [id number] Procedure:                Upper GI endoscopy Indications:              Generalized abdominal pain, Abdominal bloating,                            Early satiety, Eructation, Nausea Medicines:                Monitored Anesthesia Care Procedure:                Pre-Anesthesia Assessment:                           - Prior to the procedure, a History and Physical                            was performed, and patient medications and                            allergies were reviewed. The patient's tolerance of                            previous anesthesia was also reviewed. The risks                            and benefits of the procedure and the sedation                            options and risks were discussed with the patient.                            All questions were answered, and informed consent                            was obtained. Prior Anticoagulants: The patient has                            taken no anticoagulant or antiplatelet agents. ASA                            Grade Assessment: II - A patient with mild systemic                            disease. After reviewing the risks and benefits,                            the patient was deemed in satisfactory condition to                            undergo the procedure.  After obtaining informed consent, the endoscope was                            passed under direct vision. Throughout the                            procedure, the patient's blood pressure, pulse, and                            oxygen saturations were monitored continuously. The                            GIF [id number] was introduced through the                            mouth, and advanced to the  third part of duodenum.                            The upper GI endoscopy was accomplished without                            difficulty. The patient tolerated the procedure                            well. Scope In: Scope Out: Findings:                 The examined esophagus was normal.                           The Z-line was regular and was found 35 cm from the                            incisors.                           The gastroesophageal flap valve was visualized                            endoscopically and classified as Hill Grade III                            (minimal fold, loose to endoscope, hiatal hernia                            likely).                           Diffuse mild inflammation characterized by                            congestion (edema) and erythema was found in the                            gastric fundus, in the gastric body and in the  gastric antrum. Biopsies were taken with a cold                            forceps for histology and Helicobacter pylori                            testing. Estimated blood loss was minimal.                           The examined duodenum was normal. Biopsies were                            taken with a cold forceps for histology. Estimated                            blood loss was minimal. Complications:            No immediate complications. Estimated Blood Loss:     Estimated blood loss was minimal. Impression:               - Normal esophagus.                           - Z-line regular, 35 cm from the incisors.                           - Gastroesophageal flap valve classified as Hill                            Grade III (minimal fold, loose to endoscope, hiatal                            hernia likely).                           - Mild, non-ulcer gastritis. Biopsied.                           - Normal examined duodenum. Biopsied. Recommendation:           - Patient has a contact number  available for                            emergencies. The signs and symptoms of potential                            delayed complications were discussed with the                            patient. Return to normal activities tomorrow.                            Written discharge instructions were provided to the                            patient.                           -  Resume previous diet.                           - Continue present medications.                           - Await pathology results.                           - Stop omeprazole  (Prilosec). Start Protonix                             (pantoprazole ) 40 mg PO BID for 4 weeks to promote                            mucosal healing, then reduce to 40 mg daily.                           - Perform a colonoscopy today. Sandor Flatter, MD 01/12/2024 3:00:34 PM

## 2024-01-12 NOTE — Progress Notes (Signed)
 Pt's states no medical or surgical changes since previsit or office visit.

## 2024-01-12 NOTE — Patient Instructions (Signed)
Discharge instructions given. Handouts on polyps,Diverticulosis,Hemorrhoids and Gastritis. Prescription sent to pharmacy. Resume previous medications. YOU HAD AN ENDOSCOPIC PROCEDURE TODAY AT THE [facility]:   Refer to the procedure report that was given to you for any specific questions about what was found during the examination.  If the procedure report does not answer your questions, please call your gastroenterologist to clarify.  If you requested that your care partner not be given the details of your procedure findings, then the procedure report has been included in a sealed envelope for you to review at your convenience later.  YOU SHOULD EXPECT: Some feelings of bloating in the abdomen. Passage of more gas than usual.  Walking can help get rid of the air that was put into your GI tract during the procedure and reduce the bloating. If you had a lower endoscopy (such as a colonoscopy or flexible sigmoidoscopy) you may notice spotting of blood in your stool or on the toilet paper. If you underwent a bowel prep for your procedure, you may not have a normal bowel movement for a few days.  Please Note:  You might notice some irritation and congestion in your nose or some drainage.  This is from the oxygen used during your procedure.  There is no need for concern and it should clear up in a day or so.  SYMPTOMS TO REPORT IMMEDIATELY:  Following lower endoscopy (colonoscopy or flexible sigmoidoscopy):  Excessive amounts of blood in the stool  Significant tenderness or worsening of abdominal pains  Swelling of the abdomen that is new, acute  Fever of 100F or higher  Following upper endoscopy (EGD)  Vomiting of blood or coffee ground material  New chest pain or pain under the shoulder blades  Painful or persistently difficult swallowing  New shortness of breath  Fever of 100F or higher  Black, tarry-looking stools  For urgent or emergent issues, a gastroenterologist can be  reached at any hour by calling [phone]. Do not use MyChart messaging for urgent concerns.    DIET:  We do recommend a small meal at first, but then you may proceed to your regular diet.  Drink plenty of fluids but you should avoid alcoholic beverages for 24 hours.  ACTIVITY:  You should plan to take it easy for the rest of today and you should NOT DRIVE or use heavy machinery until tomorrow (because of the sedation medicines used during the test).    FOLLOW UP: Our staff will call the number listed on your records the next business day following your procedure.  We will call around 7:15- 8:00 am to check on you and address any questions or concerns that you may have regarding the information given to you following your procedure. If we do not reach you, we will leave a message.     If any biopsies were taken you will be contacted by phone or by letter within the next 1-3 weeks.  Please call us at [phone] if you have not heard about the biopsies in 3 weeks.    SIGNATURES/CONFIDENTIALITY: You and/or your care partner have signed paperwork which will be entered into your electronic medical record.  These signatures attest to the fact that that the information above on your After Visit Summary has been reviewed and is understood.  Full responsibility of the confidentiality of this discharge information lies with you and/or your care-partner.

## 2024-01-12 NOTE — Op Note (Signed)
 [facility] Patient Name: Katie Rhodes Procedure Date: 01/12/2024 2:21 PM MRN: [id number] Endoscopist: Sandor Flatter , MD, [id number] Age: 68 Referring MD:  Date of Birth: 1956-02-10 Gender: Female Account #: [id number] Procedure:                Colonoscopy Indications:              Surveillance: Personal history of adenomatous                            polyps on last colonoscopy > 5 years ago                           Last colonoscopy was 08/08/2014 and notable for                            diverticulosis, 4 mm pedunculated adenoma removed                            from descending colon.                           Separately, history of constipation. Medicines:                Monitored Anesthesia Care Procedure:                Pre-Anesthesia Assessment:                           - Prior to the procedure, a History and Physical                            was performed, and patient medications and                            allergies were reviewed. The patient's tolerance of                            previous anesthesia was also reviewed. The risks                            and benefits of the procedure and the sedation                            options and risks were discussed with the patient.                            All questions were answered, and informed consent                            was obtained. Prior Anticoagulants: The patient has                            taken no anticoagulant or antiplatelet agents. ASA  Grade Assessment: II - A patient with mild systemic                            disease. After reviewing the risks and benefits,                            the patient was deemed in satisfactory condition to                            undergo the procedure.                           After obtaining informed consent, the colonoscope                            was passed under direct vision. Throughout the                             procedure, the patient's blood pressure, pulse, and                            oxygen saturations were monitored continuously. The                            CF [id number] #[id number] was introduced through the anus                            and advanced to the the terminal ileum. The                            colonoscopy was performed without difficulty. The                            patient tolerated the procedure well. The quality                            of the bowel preparation was good. The terminal                            ileum, ileocecal valve, appendiceal orifice, and                            rectum were photographed. Scope In: 2:38:42 PM Scope Out: 2:54:28 PM Scope Withdrawal Time: 0 hours 12 minutes 11 seconds  Total Procedure Duration: 0 hours 15 minutes 46 seconds  Findings:                 The perianal and digital rectal examinations were                            normal.                           A 5 mm polyp was found in the ascending colon. The  polyp was sessile. The polyp was removed with a                            cold snare. Resection and retrieval were complete.                            Estimated blood loss was minimal.                           A few medium-mouthed and small-mouthed diverticula                            were found in the ascending colon.                           The exam was otherwise normal throughout the                            remainder of the colon.                           Non-bleeding internal hemorrhoids were found during                            retroflexion. The hemorrhoids were small and Grade                            II (internal hemorrhoids that prolapse but reduce                            spontaneously).                           The terminal ileum appeared normal. Complications:            No immediate complications. Estimated Blood Loss:     Estimated blood loss was  minimal. Impression:               - One 5 mm polyp in the ascending colon, removed                            with a cold snare. Resected and retrieved.                           - Diverticulosis in the ascending colon.                           - Non-bleeding internal hemorrhoids.                           - The examined portion of the ileum was normal. Recommendation:           - Patient has a contact number available for                            emergencies. The signs and symptoms of potential  delayed complications were discussed with the                            patient. Return to normal activities tomorrow.                            Written discharge instructions were provided to the                            patient.                           - Resume previous diet.                           - Continue present medications.                           - Await pathology results.                           - Repeat colonoscopy for surveillance based on                            pathology results.                           - Return to GI office PRN. Sandor Flatter, MD 01/12/2024 3:06:04 PM

## 2024-01-12 NOTE — Progress Notes (Signed)
 A/o x 3, VSS, gd SR's, pleased with anesthesia, report to RN

## 2024-01-12 NOTE — Progress Notes (Signed)
 Called to room to assist during endoscopic procedure.  Patient ID and intended procedure confirmed with present staff. Received instructions for my participation in the procedure from the performing physician.

## 2024-01-13 ENCOUNTER — Telehealth: Payer: Self-pay | Admitting: *Deleted

## 2024-01-13 NOTE — Telephone Encounter (Signed)
 Attempted post procedure follow up call.  No answer - LVM.  [employer] used today at the [facility] for this pts post procedure follow up call.

## 2024-01-17 LAB — SURGICAL PATHOLOGY

## 2024-01-19 ENCOUNTER — Ambulatory Visit: Payer: Self-pay | Admitting: Gastroenterology

## 2024-01-19 DIAGNOSIS — A048 Other specified bacterial intestinal infections: Secondary | ICD-10-CM

## 2024-01-19 MED ORDER — BISMUTH SUBSALICYLATE 262 MG PO CHEW: 524.0000 mg | CHEWABLE_TABLET | Freq: Four times a day (QID) | ORAL | 0 refills | Status: AC

## 2024-01-19 MED ORDER — METRONIDAZOLE 250 MG PO TABS: 250.0000 mg | ORAL_TABLET | Freq: Four times a day (QID) | ORAL | 0 refills | Status: AC

## 2024-01-19 MED ORDER — DOXYCYCLINE HYCLATE 100 MG PO TABS: 100.0000 mg | ORAL_TABLET | Freq: Two times a day (BID) | ORAL | 0 refills | Status: AC

## 2024-02-10 NOTE — Telephone Encounter (Signed)
 Patient and her daughter Katie Rhodes had questions regarding when H. Pylori stool antigen should be rechecked.  They were instructed to recheck H. Pylori stool antigen should be rechecked 4 weeks after medication was completed.  Madinah verbalized understanding with no further questions.

## 2024-02-10 NOTE — Telephone Encounter (Signed)
 Patient called and stated that she was returning a call back to [place]. Patient is requesting a call back. Please advise.

## 2024-03-07 ENCOUNTER — Other Ambulatory Visit

## 2024-03-07 DIAGNOSIS — A048 Other specified bacterial intestinal infections: Secondary | ICD-10-CM

## 2024-03-09 ENCOUNTER — Ambulatory Visit: Payer: Self-pay | Admitting: Gastroenterology

## 2024-03-09 LAB — H. PYLORI ANTIGEN, STOOL: H pylori Ag, Stl: NEGATIVE

## 2024-04-09 ENCOUNTER — Other Ambulatory Visit: Payer: Self-pay | Admitting: Gastroenterology

## 2024-04-09 DIAGNOSIS — K2970 Gastritis, unspecified, without bleeding: Secondary | ICD-10-CM

## 2024-07-05 ENCOUNTER — Other Ambulatory Visit: Payer: Self-pay | Admitting: Gastroenterology

## 2024-07-05 DIAGNOSIS — K2970 Gastritis, unspecified, without bleeding: Secondary | ICD-10-CM
